# Patient Record
Sex: Female | Born: 1980 | Race: White | Hispanic: No | Marital: Single | Smoking: Current every day smoker
Health system: Southern US, Community
[De-identification: ages and names within clinical notes are randomized; demographics above are authoritative.]

## PROBLEM LIST (undated history)

## (undated) DIAGNOSIS — F319 Bipolar disorder, unspecified: Secondary | ICD-10-CM

## (undated) DIAGNOSIS — F4310 Post-traumatic stress disorder, unspecified: Secondary | ICD-10-CM

## (undated) DIAGNOSIS — F32A Depression, unspecified: Secondary | ICD-10-CM

## (undated) DIAGNOSIS — T1491XA Suicide attempt, initial encounter: Secondary | ICD-10-CM

## (undated) DIAGNOSIS — K8020 Calculus of gallbladder without cholecystitis without obstruction: Secondary | ICD-10-CM

## (undated) DIAGNOSIS — E039 Hypothyroidism, unspecified: Secondary | ICD-10-CM

## (undated) DIAGNOSIS — F329 Major depressive disorder, single episode, unspecified: Secondary | ICD-10-CM

## (undated) DIAGNOSIS — M1990 Unspecified osteoarthritis, unspecified site: Secondary | ICD-10-CM

## (undated) DIAGNOSIS — F419 Anxiety disorder, unspecified: Secondary | ICD-10-CM

## (undated) HISTORY — PX: KNEE SURGERY: SHX244

## (undated) HISTORY — DX: Post-traumatic stress disorder, unspecified: F43.10

## (undated) HISTORY — DX: Bipolar disorder, unspecified: F31.9

---
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
## 2001-06-07 ENCOUNTER — Encounter: Admission: RE | Admit: 2001-06-07 | Discharge: 2001-06-07 | Payer: Self-pay | Admitting: Physician Assistant

## 2001-10-26 ENCOUNTER — Other Ambulatory Visit: Admission: RE | Admit: 2001-10-26 | Discharge: 2001-10-26 | Payer: Self-pay | Admitting: Obstetrics and Gynecology

## 2002-09-02 ENCOUNTER — Emergency Department (HOSPITAL_COMMUNITY): Admission: EM | Admit: 2002-09-02 | Discharge: 2002-09-03 | Payer: Self-pay | Admitting: Emergency Medicine

## 2004-10-15 ENCOUNTER — Other Ambulatory Visit: Admission: RE | Admit: 2004-10-15 | Discharge: 2004-10-15 | Payer: Self-pay | Admitting: Obstetrics and Gynecology

## 2006-06-08 ENCOUNTER — Emergency Department (HOSPITAL_COMMUNITY): Admission: EM | Admit: 2006-06-08 | Discharge: 2006-06-09 | Payer: Self-pay | Admitting: Emergency Medicine

## 2006-12-31 ENCOUNTER — Emergency Department (HOSPITAL_COMMUNITY): Admission: EM | Admit: 2006-12-31 | Discharge: 2006-12-31 | Payer: Self-pay | Admitting: Emergency Medicine

## 2007-09-25 ENCOUNTER — Emergency Department (HOSPITAL_COMMUNITY): Admission: EM | Admit: 2007-09-25 | Discharge: 2007-09-25 | Payer: Self-pay | Admitting: Emergency Medicine

## 2008-03-06 ENCOUNTER — Emergency Department (HOSPITAL_BASED_OUTPATIENT_CLINIC_OR_DEPARTMENT_OTHER): Admission: EM | Admit: 2008-03-06 | Discharge: 2008-03-06 | Payer: Self-pay | Admitting: Emergency Medicine

## 2008-04-11 ENCOUNTER — Ambulatory Visit (HOSPITAL_COMMUNITY): Admission: RE | Admit: 2008-04-11 | Discharge: 2008-04-11 | Payer: Self-pay | Admitting: Obstetrics and Gynecology

## 2010-01-02 ENCOUNTER — Encounter: Admission: RE | Admit: 2010-01-02 | Discharge: 2010-01-02 | Payer: Self-pay | Admitting: Family Medicine

## 2010-01-14 ENCOUNTER — Emergency Department (HOSPITAL_BASED_OUTPATIENT_CLINIC_OR_DEPARTMENT_OTHER): Admission: EM | Admit: 2010-01-14 | Discharge: 2010-01-14 | Payer: Self-pay | Admitting: Emergency Medicine

## 2010-09-21 ENCOUNTER — Encounter: Payer: Self-pay | Admitting: Gastroenterology

## 2010-11-17 LAB — DIFFERENTIAL
Basophils Absolute: 0.1 10*3/uL (ref 0.0–0.1)
Basophils Relative: 1 % (ref 0–1)
Lymphocytes Relative: 33 % (ref 12–46)
Monocytes Absolute: 1 10*3/uL (ref 0.1–1.0)
Neutro Abs: 4.1 10*3/uL (ref 1.7–7.7)
Neutrophils Relative %: 53 % (ref 43–77)

## 2010-11-17 LAB — COMPREHENSIVE METABOLIC PANEL
Albumin: 3.9 g/dL (ref 3.5–5.2)
Alkaline Phosphatase: 65 U/L (ref 39–117)
BUN: 8 mg/dL (ref 6–23)
Chloride: 108 mEq/L (ref 96–112)
Creatinine, Ser: 0.6 mg/dL (ref 0.4–1.2)
Glucose, Bld: 70 mg/dL (ref 70–99)
Potassium: 4.3 mEq/L (ref 3.5–5.1)
Total Bilirubin: 0.4 mg/dL (ref 0.3–1.2)
Total Protein: 7.2 g/dL (ref 6.0–8.3)

## 2010-11-17 LAB — URINALYSIS, ROUTINE W REFLEX MICROSCOPIC
Bilirubin Urine: NEGATIVE
Glucose, UA: NEGATIVE mg/dL
Hgb urine dipstick: NEGATIVE
Ketones, ur: NEGATIVE mg/dL
Protein, ur: NEGATIVE mg/dL
pH: 6 (ref 5.0–8.0)

## 2010-11-17 LAB — ROCKY MTN SPOTTED FVR AB, IGM-BLOOD: RMSF IgM: 0.27 IV (ref 0.00–0.89)

## 2010-11-17 LAB — CBC
HCT: 45.1 % (ref 36.0–46.0)
Hemoglobin: 15.3 g/dL — ABNORMAL HIGH (ref 12.0–15.0)
MCV: 93.2 fL (ref 78.0–100.0)
Platelets: 220 10*3/uL (ref 150–400)
RDW: 12.9 % (ref 11.5–15.5)

## 2010-11-17 LAB — B. BURGDORFI ANTIBODIES: B burgdorferi Ab IgG+IgM: 0.43 {ISR}

## 2010-11-17 LAB — ROCKY MTN SPOTTED FVR AB, IGG-BLOOD: RMSF IgG: 0.27 IV

## 2010-11-17 LAB — PREGNANCY, URINE: Preg Test, Ur: NEGATIVE

## 2011-01-13 NOTE — H&P (Signed)
[name], [name]            ACCOUNT NO.:  [id number]   MEDICAL RECORD NO.:  [id number]          PATIENT TYPE:  AMB   LOCATION:  [facility]C                           FACILITY:  [facility]   PHYSICIAN:  Lenoard Aden, M.D.DATE OF BIRTH:  [birth year]   DATE OF ADMISSION:  04/11/2008  DATE OF DISCHARGE:                              HISTORY & PHYSICAL   CHIEF COMPLAINT:  Dyspareunia and dysmenorrhea.   She is a 30 year old white female G0, P0 with worsening dysmenorrhea,  menorrhagia, and pelvic dyspareunia who presents for evaluation by  laparoscopy.   She has no known allergies.   MEDICATIONS:  Multivitamins.   She has a family history of diabetes, heart disease, chronic  hypertension, and lung cancer.   She is a nonsmoker and nondrinker.  She denies domestic or physical  violence.   Surgical history is remarkable for breast reduction, hip surgery, and  knee surgery x3.  She has a history of inability to conceive.   Currently, on no medications and normal laboratory workup to date.   PHYSICAL EXAMINATION:  GENERAL:  She is a well-developed, well-nourished  white female in no acute distress.  VITAL SIGNS:  Blood pressure of 104/80, weight of 219 pounds.  HEENT:  Normal.  LUNGS:  Clear.  HEART:  Regular rate and rhythm.  ABDOMEN:  Soft and nontender.  PELVIC:  Normal size, shape, and contour uterus and no adnexal masses.   IMPRESSION:  Worsening dysmenorrhea and dyspareunia for surgical  intervention.   PLAN:  Proceed with diagnostic laparoscopy, possible ablation of  endometriosis, possible chromopertubation risks of anesthesia,  infection, bleeding, injury to abdominal organs, need for repair were  discussed.  Delayed versus immediate complications to include bowel and  bladder injury noted.  The patient acknowledges and wishes to proceed.      Lenoard Aden, M.D.  Electronically Signed     RJT/MEDQ  D:  04/11/2008  T:  04/12/2008  Job:  [id number]

## 2011-01-13 NOTE — Op Note (Signed)
[name] [name], [name] [name]            ACCOUNT NO.:  [id number]   MEDICAL RECORD NO.:  [id number]          PATIENT TYPE:  AMB   LOCATION:  [facility]C                           FACILITY:  [facility]   PHYSICIAN:  Lenoard Aden, M.D.DATE OF BIRTH:  1980/12/10   DATE OF PROCEDURE:  04/11/2008  DATE OF DISCHARGE:                               OPERATIVE REPORT   PREOPERATIVE DIAGNOSES:  1. Pelvic pain.  2. Dysmenorrhea.  3. Dyspareunia.   POSTOPERATIVE DIAGNOSES:  1. Pelvic endometriosis.  2. Right paraovarian adhesions to the right ovarian cul-de-sac.   PROCEDURES:  1. Diagnostic laparoscopy.  2. Lysis of adhesions.  3. Ablation of cul-de-sac endometriosis.  4. Chromopertubation.   SURGEON:  Lenoard Aden, MD   ANESTHESIA:  General.   ESTIMATED BLOOD LOSS:  Less than 50 mL.   COMPLICATIONS:  None.   DRAINS:  None.   COUNTS:  Correct.   The patient was taken to recovery in good condition.   BRIEF OPERATIVE NOTE:  After being apprised of the risks of anesthesia,  infection, bleeding, injury to abdominal organs, and need for repair,  labor's immediate complications including bowel and bladder injury,  inability to cure pelvic pain, the patient brought to the operating room  where she was administered a general anesthetic without complications,  prepped and draped in usual sterile fashion, catheterized till the  bladder was empty.  Exam under anesthesia reveals an anteflexed uterus  with no adnexal masses.  Bladder was catheterized for about 100 mL of  clear urine without difficulty.  Rubens cannula was placed per vagina  and hooked up for chromopertubation.  At this time, an infraumbilical  incision made with a scalpel after placement of dilute Marcaine  solution.  Veress needle placed, opening pressure -1 noted, 3.5 L of CO2  insufflated without difficulty.  Trocar placed atraumatically and  pictures taken.  Normal liver gallbladder bed, normal appendiceal area,  normal uterus,  normal anterior cul-de-sac, and normal left adnexa.  Both  tubes appear fine and delicate and contour in both appeared normal.  No  evidence of hydrosalpinx.  The left and right ovary do also appear  normal; however, the right ovary is encased and filmy adhesions which is  adhering it into the right ovarian fossa.  A 5-mm Treitz site is made  suprapubically and these adhesions are lysed close to the ovary using  delicate sharp dissection and completely freeing the ovary to a  completely mobile status.  Good hemostasis was noted and hemostasis was  achieved by cauterizing these filmy implants with a Kleppinger bipolar  cautery.  The left ovary otherwise appears normal.  The right ovary  appears normal.  There are some left cul-de-sac implants, which appear  to be vesicular and formed consistent with active endometriosis.  These  are grasped, elevated with a Kleppinger, and cauterized completely  ablating these lesions after observing the course of the left ureter.  Good hemostasis was noted.  Irrigation accomplished.  All instruments  were removed under direct visualization.  CO2 released.  Incisions  closed using 0 Vicryl and Dermabond.  Instrument was removed from  the  vagina.  The patient was awakened and transferred to recovery room in  good condition.      Lenoard Aden, M.D.  Electronically Signed     RJT/MEDQ  D:  04/11/2008  T:  04/12/2008  Job:  [id number]

## 2011-02-10 ENCOUNTER — Emergency Department (HOSPITAL_COMMUNITY)
Admission: EM | Admit: 2011-02-10 | Discharge: 2011-02-10 | Disposition: A | Discharge status: Home / Self Care | Payer: Self-pay | Attending: Emergency Medicine | Admitting: Emergency Medicine

## 2011-02-10 DIAGNOSIS — E039 Hypothyroidism, unspecified: Secondary | ICD-10-CM | POA: Insufficient documentation

## 2011-02-10 DIAGNOSIS — K089 Disorder of teeth and supporting structures, unspecified: Secondary | ICD-10-CM | POA: Insufficient documentation

## 2011-02-10 DIAGNOSIS — K029 Dental caries, unspecified: Secondary | ICD-10-CM | POA: Insufficient documentation

## 2011-05-28 LAB — WET PREP, GENITAL
Clue Cells Wet Prep HPF POC: NONE SEEN
Trich, Wet Prep: NONE SEEN
Yeast Wet Prep HPF POC: NONE SEEN

## 2011-05-28 LAB — URINE MICROSCOPIC-ADD ON

## 2011-05-28 LAB — URINALYSIS, ROUTINE W REFLEX MICROSCOPIC
Bilirubin Urine: NEGATIVE
Glucose, UA: NEGATIVE
Ketones, ur: NEGATIVE
Protein, ur: NEGATIVE
Urobilinogen, UA: 0.2

## 2011-05-28 LAB — GC/CHLAMYDIA PROBE AMP, GENITAL
Chlamydia, DNA Probe: NEGATIVE
GC Probe Amp, Genital: NEGATIVE

## 2011-05-29 LAB — HCG, SERUM, QUALITATIVE: Preg, Serum: NEGATIVE

## 2011-05-29 LAB — CBC
MCHC: 34
MCV: 94.5
Platelets: 277
RBC: 4.84
RDW: 12.6

## 2011-06-22 ENCOUNTER — Ambulatory Visit (HOSPITAL_COMMUNITY): Admission: RE | Admit: 2011-06-22 | Payer: Self-pay | Source: Ambulatory Visit | Admitting: Internal Medicine

## 2011-10-13 ENCOUNTER — Emergency Department (INDEPENDENT_AMBULATORY_CARE_PROVIDER_SITE_OTHER): Payer: Self-pay

## 2011-10-13 ENCOUNTER — Encounter (HOSPITAL_BASED_OUTPATIENT_CLINIC_OR_DEPARTMENT_OTHER): Payer: Self-pay | Admitting: *Deleted

## 2011-10-13 ENCOUNTER — Emergency Department (HOSPITAL_BASED_OUTPATIENT_CLINIC_OR_DEPARTMENT_OTHER)
Admission: EM | Admit: 2011-10-13 | Discharge: 2011-10-13 | Disposition: A | Discharge status: Home / Self Care | Payer: Self-pay | Attending: Emergency Medicine | Admitting: Emergency Medicine

## 2011-10-13 DIAGNOSIS — J3489 Other specified disorders of nose and nasal sinuses: Secondary | ICD-10-CM | POA: Insufficient documentation

## 2011-10-13 DIAGNOSIS — R05 Cough: Secondary | ICD-10-CM

## 2011-10-13 DIAGNOSIS — J029 Acute pharyngitis, unspecified: Secondary | ICD-10-CM

## 2011-10-13 DIAGNOSIS — R918 Other nonspecific abnormal finding of lung field: Secondary | ICD-10-CM

## 2011-10-13 DIAGNOSIS — E079 Disorder of thyroid, unspecified: Secondary | ICD-10-CM | POA: Insufficient documentation

## 2011-10-13 DIAGNOSIS — J40 Bronchitis, not specified as acute or chronic: Secondary | ICD-10-CM | POA: Insufficient documentation

## 2011-10-13 MED ORDER — PREDNISONE 20 MG PO TABS: 60.0000 mg | ORAL_TABLET | Freq: Every day | ORAL | Status: DC

## 2011-10-13 MED ORDER — PREDNISONE 10 MG PO TABS
60.0000 mg | ORAL_TABLET | Freq: Once | ORAL | Status: AC
  Administered 2011-10-13: 60 mg via ORAL
  Filled 2011-10-13: qty 1

## 2011-10-13 MED ORDER — PREDNISONE 10 MG PO TABS: 60.0000 mg | ORAL_TABLET | Freq: Every day | ORAL | Status: DC

## 2011-10-13 MED ORDER — ALBUTEROL SULFATE HFA 108 (90 BASE) MCG/ACT IN AERS
2.0000 | INHALATION_SPRAY | RESPIRATORY_TRACT | Status: DC | PRN
  Administered 2011-10-13: 2 via RESPIRATORY_TRACT
  Filled 2011-10-13: qty 6.7

## 2011-10-13 MED ORDER — ALBUTEROL SULFATE (5 MG/ML) 0.5% IN NEBU
2.5000 mg | INHALATION_SOLUTION | Freq: Once | RESPIRATORY_TRACT | Status: AC
  Administered 2011-10-13: 2.5 mg via RESPIRATORY_TRACT
  Filled 2011-10-13 (×2): qty 0.5

## 2011-10-13 MED ORDER — IPRATROPIUM BROMIDE 0.02 % IN SOLN
0.5000 mg | Freq: Once | RESPIRATORY_TRACT | Status: AC
  Administered 2011-10-13: 0.5 mg via RESPIRATORY_TRACT
  Filled 2011-10-13: qty 2.5

## 2011-10-13 NOTE — Discharge Instructions (Signed)
Bronchitis Bronchitis is the body's way of reacting to injury and/or infection (inflammation) of the bronchi. Bronchi are the air tubes that extend from the windpipe into the lungs. If the inflammation becomes severe, it may cause shortness of breath. CAUSES  Inflammation may be caused by:  A virus.   Germs (bacteria).   Dust.   Allergens.   Pollutants and many other irritants.  The cells lining the bronchial tree are covered with tiny hairs (cilia). These constantly beat upward, away from the lungs, toward the mouth. This keeps the lungs free of pollutants. When these cells become too irritated and are unable to do their job, mucus begins to develop. This causes the characteristic cough of bronchitis. The cough clears the lungs when the cilia are unable to do their job. Without either of these protective mechanisms, the mucus would settle in the lungs. Then you would develop pneumonia. Smoking is a common cause of bronchitis and can contribute to pneumonia. Stopping this habit is the single most important thing you can do to help yourself. TREATMENT   Your caregiver may prescribe an antibiotic if the cough is caused by bacteria. Also, medicines that open up your airways make it easier to breathe. Your caregiver may also recommend or prescribe an expectorant. It will loosen the mucus to be coughed up. Only take over-the-counter or prescription medicines for pain, discomfort, or fever as directed by your caregiver.   Removing whatever causes the problem (smoking, for example) is critical to preventing the problem from getting worse.   Cough suppressants may be prescribed for relief of cough symptoms.   Inhaled medicines may be prescribed to help with symptoms now and to help prevent problems from returning.   For those with recurrent (chronic) bronchitis, there may be a need for steroid medicines.  SEEK IMMEDIATE MEDICAL CARE IF:   During treatment, you develop more pus-like mucus  (purulent sputum).   You have a fever.   Your baby is older than 3 months with a rectal temperature of 102 F (38.9 C) or higher.   Your baby is 36 months old or younger with a rectal temperature of 100.4 F (38 C) or higher.   You become progressively more ill.   You have increased difficulty breathing, wheezing, or shortness of breath.  It is necessary to seek immediate medical care if you are elderly or sick from any other disease. MAKE SURE YOU:   Understand these instructions.   Will watch your condition.   Will get help right away if you are not doing well or get worse.  Document Released: 08/17/2005 Document Revised: 04/29/2011 Document Reviewed: 06/26/2008 [facility] Patient Information 2012 [place], [place]. Pharyngitis, Viral and Bacterial Pharyngitis is soreness (inflammation) or infection of the pharynx. It is also called a sore throat. CAUSES  Most sore throats are caused by viruses and are part of a cold. However, some sore throats are caused by strep and other bacteria. Sore throats can also be caused by post nasal drip from draining sinuses, allergies and sometimes from sleeping with an open mouth. Infectious sore throats can be spread from person to person by coughing, sneezing and sharing cups or eating utensils. TREATMENT  Sore throats that are viral usually last 3-4 days. Viral illness will get better without medications (antibiotics). Strep throat and other bacterial infections will usually begin to get better about 24-48 hours after you begin to take antibiotics. HOME CARE INSTRUCTIONS   If the caregiver feels there is a bacterial infection or  if there is a positive strep test, they will prescribe an antibiotic. The full course of antibiotics must be taken. If the full course of antibiotic is not taken, you or your child may become ill again. If you or your child has strep throat and do not finish all of the medication, serious heart or kidney diseases may develop.     Drink enough water and fluids to keep your urine clear or pale yellow.   Only take over-the-counter or prescription medicines for pain, discomfort or fever as directed by your caregiver.   Get lots of rest.   Gargle with salt water ( tsp. of salt in a glass of water) as often as every 1-2 hours as you need for comfort.   Hard candies may soothe the throat if individual is not at risk for choking. Throat sprays or lozenges may also be used.  SEEK MEDICAL CARE IF:   Large, tender lumps in the neck develop.   A rash develops.   Green, yellow-brown or bloody sputum is coughed up.   Your baby is older than 3 months with a rectal temperature of 100.5 F (38.1 C) or higher for more than 1 day.  SEEK IMMEDIATE MEDICAL CARE IF:   A stiff neck develops.   You or your child are drooling or unable to swallow liquids.   You or your child are vomiting, unable to keep medications or liquids down.   You or your child has severe pain, unrelieved with recommended medications.   You or your child are having difficulty breathing (not due to stuffy nose).   You or your child are unable to fully open your mouth.   You or your child develop redness, swelling, or severe pain anywhere on the neck.   You have a fever.   Your baby is older than 3 months with a rectal temperature of 102 F (38.9 C) or higher.   Your baby is 54 months old or younger with a rectal temperature of 100.4 F (38 C) or higher.  MAKE SURE YOU:   Understand these instructions.   Will watch your condition.   Will get help right away if you are not doing well or get worse.  Document Released: 08/17/2005 Document Revised: 04/29/2011 Document Reviewed: 11/14/2007 [facility] Patient Information 2012 [place], [place].  Albuterol inhalation aerosol What is this medicine? ALBUTEROL (al Gaspar Bidding) is a bronchodilator. It helps open up the airways in your lungs to make it easier to breathe. This medicine is used to treat  and to prevent bronchospasm. This medicine may be used for other purposes; ask your health care provider or pharmacist if you have questions. What should I tell my health care provider before I take this medicine? They need to know if you have any of the following conditions: -diabetes -heart disease or irregular heartbeat -high blood pressure -pheochromocytoma -seizures -thyroid disease -an unusual or allergic reaction to albuterol, levalbuterol, sulfites, other medicines, foods, dyes, or preservatives -pregnant or trying to get pregnant -breast-feeding How should I use this medicine? This medicine is for inhalation through the mouth. Follow the directions on your prescription label. Take your medicine at regular intervals. Do not use more often than directed. Make sure that you are using your inhaler correctly. Ask you doctor or health care provider if you have any questions. Use this medicine before you use any other inhaler. Wait 5 minutes or more before between using different inhalers. Talk to your pediatrician regarding the use of this  medicine in children. Special care may be needed. Overdosage: If you think you have taken too much of this medicine contact a poison control center or emergency room at once. NOTE: This medicine is only for you. Do not share this medicine with others. What if I miss a dose? If you miss a dose, use it as soon as you can. If it is almost time for your next dose, use only that dose. Do not use double or extra doses. What may interact with this medicine? -anti-infectives like chloroquine and pentamidine -caffeine -cisapride -diuretics -medicines for colds -medicines for depression or for emotional or psychotic conditions -medicines for weight loss including some herbal products -methadone -some antibiotics like clarithromycin, erythromycin, levofloxacin, and linezolid -some heart medicines -steroid hormones like dexamethasone, cortisone,  hydrocortisone -theophylline -thyroid hormones This list may not describe all possible interactions. Give your health care provider a list of all the medicines, herbs, non-prescription drugs, or dietary supplements you use. Also tell them if you smoke, drink alcohol, or use illegal drugs. Some items may interact with your medicine. What should I watch for while using this medicine? Tell your doctor or health care professional if your symptoms do not improve. Do not use extra albuterol. If your asthma or bronchitis gets worse while you are using this medicine, call your doctor right away. If your mouth gets dry try chewing sugarless gum or sucking hard candy. Drink water as directed. What side effects may I notice from receiving this medicine? Side effects that you should report to your doctor or health care professional as soon as possible: -allergic reactions like skin rash, itching or hives, swelling of the face, lips, or tongue -breathing problems -chest pain -feeling faint or lightheaded, falls -high blood pressure -irregular heartbeat -fever -muscle cramps or weakness -pain, tingling, numbness in the hands or feet -vomiting Side effects that usually do not require medical attention (report to your doctor or health care professional if they continue or are bothersome): -cough -difficulty sleeping -headache -nervousness or trembling -stomach upset -stuffy or runny nose -throat irritation -unusual taste This list may not describe all possible side effects. Call your doctor for medical advice about side effects. You may report side effects to FDA at 1-800-FDA-1088. Where should I keep my medicine? Keep out of the reach of children. Store at room temperature between 15 and 30 degrees C (59 and 86 degrees F). The contents are under pressure and may burst when exposed to heat or flame. Do not freeze. This medicine does not work as well if it is too cold. Throw away any unused medicine  after the expiration date. Inhalers need to be thrown away after the labeled number of puffs have been used or by the expiration date; whichever comes first. Ventolin HFA should be thrown away 12 months after removing from foil pouch. Check the instructions that come with your medicine. NOTE: This sheet is a summary. It may not cover all possible information. If you have questions about this medicine, talk to your doctor, pharmacist, or health care provider.  2012, [employer]. (01/02/2011 11:00:52 AM)  Prednisone tablets What is this medicine? PREDNISONE (PRED ni sone) is a corticosteroid. It is commonly used to treat inflammation of the skin, joints, lungs, and other organs. Common conditions treated include asthma, allergies, and arthritis. It is also used for other conditions, such as blood disorders and diseases of the adrenal glands. This medicine may be used for other purposes; ask your health care provider or pharmacist if  you have questions. What should I tell my health care provider before I take this medicine? They need to know if you have any of these conditions: -Cushing's syndrome -diabetes -glaucoma -heart disease -high blood pressure -infection (especially a virus infection such as chickenpox, cold sores, or herpes) -kidney disease -liver disease -mental illness -myasthenia gravis -osteoporosis -seizures -stomach or intestine problems -thyroid disease -an unusual or allergic reaction to lactose, prednisone, other medicines, foods, dyes, or preservatives -pregnant or trying to get pregnant -breast-feeding How should I use this medicine? Take this medicine by mouth with a glass of water. Follow the directions on the prescription label. Take this medicine with food. If you are taking this medicine once a day, take it in the morning. Do not take more medicine than you are told to take. Do not suddenly stop taking your medicine because you may develop a severe  reaction. Your doctor will tell you how much medicine to take. If your doctor wants you to stop the medicine, the dose may be slowly lowered over time to avoid any side effects. Talk to your pediatrician regarding the use of this medicine in children. Special care may be needed. Overdosage: If you think you have taken too much of this medicine contact a poison control center or emergency room at once. NOTE: This medicine is only for you. Do not share this medicine with others. What if I miss a dose? If you miss a dose, take it as soon as you can. If it is almost time for your next dose, talk to your doctor or health care professional. You may need to miss a dose or take an extra dose. Do not take double or extra doses without advice. What may interact with this medicine? Do not take this medicine with any of the following medications: -metyrapone -mifepristone This medicine may also interact with the following medications: -aminoglutethimide -amphotericin B -aspirin and aspirin-like medicines -barbiturates -certain medicines for diabetes, like glipizide or glyburide -cholestyramine -cholinesterase inhibitors -cyclosporine -digoxin -diuretics -ephedrine -female hormones, like estrogens and birth control pills -isoniazid -ketoconazole -NSAIDS, medicines for pain and inflammation, like ibuprofen or naproxen -phenytoin -rifampin -toxoids -vaccines -warfarin This list may not describe all possible interactions. Give your health care provider a list of all the medicines, herbs, non-prescription drugs, or dietary supplements you use. Also tell them if you smoke, drink alcohol, or use illegal drugs. Some items may interact with your medicine. What should I watch for while using this medicine? Visit your doctor or health care professional for regular checks on your progress. If you are taking this medicine over a prolonged period, carry an identification card with your name and address, the  type and dose of your medicine, and your doctor's name and address. This medicine may increase your risk of getting an infection. Tell your doctor or health care professional if you are around anyone with measles or chickenpox, or if you develop sores or blisters that do not heal properly. If you are going to have surgery, tell your doctor or health care professional that you have taken this medicine within the last twelve months. Ask your doctor or health care professional about your diet. You may need to lower the amount of salt you eat. This medicine may affect blood sugar levels. If you have diabetes, check with your doctor or health care professional before you change your diet or the dose of your diabetic medicine. What side effects may I notice from receiving this medicine? Side effects that you  should report to your doctor or health care professional as soon as possible: -allergic reactions like skin rash, itching or hives, swelling of the face, lips, or tongue -changes in emotions or moods -changes in vision -depressed mood -eye pain -fever or chills, cough, sore throat, pain or difficulty passing urine -increased thirst -swelling of ankles, feet Side effects that usually do not require medical attention (report to your doctor or health care professional if they continue or are bothersome): -confusion, excitement, restlessness -headache -nausea, vomiting -skin problems, acne, thin and shiny skin -trouble sleeping -weight gain This list may not describe all possible side effects. Call your doctor for medical advice about side effects. You may report side effects to FDA at 1-800-FDA-1088. Where should I keep my medicine? Keep out of the reach of children. Store at room temperature between 15 and 30 degrees C (59 and 86 degrees F). Protect from light. Keep container tightly closed. Throw away any unused medicine after the expiration date. NOTE: This sheet is a summary. It may not  cover all possible information. If you have questions about this medicine, talk to your doctor, pharmacist, or health care provider.  2012, [employer]. (04/02/2011 10:57:14 AM)  Smoking Hazards Smoking cigarettes is extremely bad for your health. Tobacco smoke has over 200 known poisons in it. There are over 60 chemicals in tobacco smoke that cause cancer. Some of the chemicals found in cigarette smoke include:   Cyanide.   Benzene.   Formaldehyde.   Methanol (wood alcohol).   Acetylene (fuel used in welding torches).   Ammonia.  Cigarette smoke also contains the poisonous gases nitrogen oxide and carbon monoxide.  Cigarette smokers have an increased risk of many serious medical problems, including:  Lung cancer.   Lung disease (such as pneumonia, bronchitis, and emphysema).   Heart attack and chest pain due to the heart not getting enough oxygen (angina).   Heart disease and peripheral blood vessel disease.   Hypertension.   Stroke.   Oral cancer (cancer of the lip, mouth, or voice box).   Bladder cancer.   Pancreatic cancer.   Cervical cancer.   Pregnancy complications, including premature birth.   Low birthweight babies.   Early menopause.   Lower estrogen level for women.   Infertility.   Facial wrinkles.   Blindness.   Increased risk of broken bones (fractures).   Senile dementia.   Stillbirths and smaller newborn babies, birth defects, and genetic damage to sperm.   Stomach ulcers and internal bleeding.  Children of smokers have an increased risk of the following, because of secondhand smoke exposure:   Sudden infant death syndrome (SIDS).   Respiratory infections.   Lung cancer.   Heart disease.   Ear infections.  Smoking causes approximately:  90% of all lung cancer deaths in men.   80% of all lung cancer deaths in women.   90% of deaths from chronic obstructive lung disease.  Compared with nonsmokers, smoking increases  the risk of:  Coronary heart disease by 2 to 4 times.   Stroke by 2 to 4 times.   Men developing lung cancer by 23 times.   Women developing lung cancer by 13 times.   Dying from chronic obstructive lung diseases by 12 times.  Someone who smokes 2 packs a day loses about 8 years of his or her life. Even smoking lightly shortens your life expectancy by several years. You can greatly reduce the risk of medical problems for you and your family by  stopping now. Smoking is the most preventable cause of death and disease in our society. Within days of quitting smoking, your circulation returns to normal, you decrease the risk of having a heart attack, and your lung capacity improves. There may be some increased phlegm in the first few days after quitting, and it may take months for your lungs to clear up completely. Quitting for 10 years cuts your lung cancer risk to almost that of a nonsmoker. WHY IS SMOKING ADDICTIVE?  Nicotine is the chemical agent in tobacco that is capable of causing addiction or dependence.   When you smoke and inhale, nicotine is absorbed rapidly into the bloodstream through your lungs. Nicotine absorbed through the lungs is capable of creating a powerful addiction. Both inhaled and non-inhaled nicotine may be addictive.   Addiction studies of cigarettes and spit tobacco show that addiction to nicotine occurs mainly during the teen years, when young people begin using tobacco products.  WHAT ARE THE BENEFITS OF QUITTING?  There are many health benefits to quitting smoking.   Likelihood of developing cancer and heart disease decreases. Health improvements are seen almost immediately.   Blood pressure, pulse rate, and breathing patterns start returning to normal soon after quitting.   People who quit may see an improvement in their overall quality of life.  Some people choose to quit all at once. Other options include nicotine replacement products, such as patches, gum, and  nasal sprays. Do not use these products without first checking with your caregiver. QUITTING SMOKING It is not easy to quit smoking. Nicotine is addicting, and longtime habits are hard to change. To start, you can write down all your reasons for quitting, tell your family and friends you want to quit, and ask for their help. Throw your cigarettes away, chew gum or cinnamon sticks, keep your hands busy, and drink extra water or juice. Go for walks and practice deep breathing to relax. Think of all the money you are saving: around $1,000 a year, for the average pack-a-day smoker. Nicotine patches and gum have been shown to improve success at efforts to stop smoking. Zyban (bupropion) is an anti-depressant drug that can be prescribed to reduce nicotine withdrawal symptoms and to suppress the urge to smoke. Smoking is an addiction with both physical and psychological effects. Joining a stop-smoking support group can help you cope with the emotional issues. For more information and advice on programs to stop smoking, call your doctor, your local hospital, or these organizations:  American Lung Association - 1-800-LUNGUSA   American Cancer Society - 1-800-ACS-2345  Document Released: 09/24/2004 Document Revised: 04/29/2011 Document Reviewed: 05/29/2009 [facility] Patient Information 2012 [place], [place].

## 2011-10-13 NOTE — ED Notes (Signed)
Pt states that for 5 days she has had cough congestion sore throat pain with deep inspiration in rib cage area denies nausea or vomiting. Pt states she has been taking mucinex and robitussin

## 2011-10-13 NOTE — ED Provider Notes (Signed)
History     CSN: [id number]  Arrival date & time 10/13/11  0904   None     Chief Complaint  Patient presents with  . Sore Throat  . Cough  . Nasal Congestion    (Consider location/radiation/quality/duration/timing/severity/associated sxs/prior treatment) Patient is a 31 y.o. female presenting with pharyngitis and cough. The history is provided by the patient.  Sore Throat  Cough  She has been sick for the last 5 days with nasal congestion, sore throat, cough productive of greenish sputum which is occasionally blood streaked. She has had fever to 101, chills, sweats. She is complaining of some shortness of breath. There is chest pain with coughing. She is complaining of arthralgias and myalgias. She has had some ear pain. Pain is moderate to severe and she rates it 7/10. She's tried taking Robitussin, Mucinex, and NyQuil with no relief. She has had post tussive emesis, but no nausea. She denies sick contacts. She describes her symptoms as moderate to severe. She did not get a flu shot this year.  Past Medical History  Diagnosis Date  . Thyroid disease     History reviewed. No pertinent past surgical history.  History reviewed. No pertinent family history.  History  Substance Use Topics  . Smoking status: Current Everyday Smoker  . Smokeless tobacco: Not on file  . Alcohol Use: Yes     occ    OB History    Grav Para Term Preterm Abortions TAB SAB Ect Mult Living                  Review of Systems  Respiratory: Positive for cough.   All other systems reviewed and are negative.    Allergies  Review of patient's allergies indicates no known allergies.  Home Medications  No current outpatient prescriptions on file.  BP 121/78  Pulse 95  Temp(Src) 98.4 F (36.9 C) (Oral)  Resp 22  SpO2 99%  LMP 09/29/2011  Physical Exam  Nursing note and vitals reviewed.  31 year old female who is resting comfortably and in no acute distress. Vital signs are significant  for mild tachypnea with respiratory rate of 22. Oxygen saturation is 99% which is normal. Head is normocephalic and atraumatic. PERRLA, EOMI. TMs are clear. There is no sinus tenderness. Oropharynx shows moderate erythema without exudate. She has no difficulty with her secretions and phonation is normal. Neck is nontender and supple without adenopathy. Back is nontender. Lungs have a prolonged exhalation phase and wheezes and rhonchi are heard on forced exhalation. There are no rales. There is no chest wall tenderness. Heart has regular rate and rhythm without murmur. Abdomen is soft, flat, nontender without masses or hepatosplenomegaly. Extremities have full range of motion, no cyanosis or edema. Skin is warm and dry without rash. Neurologic: Mental status is normal, cranial nerves are intact, there no focal motor or sensory deficits.  ED Course  Procedures (including critical care time)  Results for orders placed during the hospital encounter of 10/13/11  RAPID STREP SCREEN      Component Value Range   Streptococcus, Group A Screen (Direct) NEGATIVE  NEGATIVE    Dg Chest 2 View  10/13/2011  *RADIOLOGY REPORT*  Clinical Data: 31 year old female with cough, congestion, sore throat.  CHEST - 2 VIEW  Comparison: 12/31/2006 and earlier.  Findings: Better lung volumes.  Cardiac size and mediastinal contours are within normal limits.  Visualized tracheal air column is within normal limits.  There is subtle increased central peribronchial thickening more  apparent at the left hilum and lower lung bronchovascular structures.  Otherwise, the lungs are clear. No pneumothorax or effusion. No acute osseous abnormality identified.  IMPRESSION: Subtle increased peribronchial opacity could reflect viral or reactive airway disease.  No focal pneumonia.  Original Report Authenticated By: Harley Hallmark, M.D.    [phone]: After albuterol nebulizer treatment, she states her breathing and cough has improved. She'll be sent  home with an albuterol inhaler and a prescription for prednisone.  1. Bronchitis   2. Pharyngitis       MDM  Respiratory tract infection-loss of the topicals or throat, rule out pneumonia. She'll be given albuterol to assess response to breathing and cough.        Dione Booze, MD 10/13/11 1024

## 2012-01-04 ENCOUNTER — Encounter (HOSPITAL_COMMUNITY): Payer: Self-pay | Admitting: *Deleted

## 2012-01-04 ENCOUNTER — Emergency Department (HOSPITAL_COMMUNITY)
Admission: EM | Admit: 2012-01-04 | Discharge: 2012-01-04 | Disposition: A | Discharge status: Other Acute Inpatient Hospital | Payer: Self-pay | Attending: Emergency Medicine | Admitting: Emergency Medicine

## 2012-01-04 ENCOUNTER — Inpatient Hospital Stay (HOSPITAL_COMMUNITY)
Admission: AD | Admit: 2012-01-04 | Discharge: 2012-01-07 | DRG: 885 | Disposition: A | Discharge status: Home / Self Care | Payer: 59 | Source: Ambulatory Visit | Attending: Psychiatry | Admitting: Psychiatry

## 2012-01-04 DIAGNOSIS — F17200 Nicotine dependence, unspecified, uncomplicated: Secondary | ICD-10-CM | POA: Diagnosis present

## 2012-01-04 DIAGNOSIS — R1013 Epigastric pain: Secondary | ICD-10-CM | POA: Insufficient documentation

## 2012-01-04 DIAGNOSIS — F3289 Other specified depressive episodes: Secondary | ICD-10-CM | POA: Insufficient documentation

## 2012-01-04 DIAGNOSIS — E079 Disorder of thyroid, unspecified: Secondary | ICD-10-CM | POA: Insufficient documentation

## 2012-01-04 DIAGNOSIS — F1990 Other psychoactive substance use, unspecified, uncomplicated: Secondary | ICD-10-CM

## 2012-01-04 DIAGNOSIS — F329 Major depressive disorder, single episode, unspecified: Secondary | ICD-10-CM | POA: Insufficient documentation

## 2012-01-04 DIAGNOSIS — R45851 Suicidal ideations: Secondary | ICD-10-CM | POA: Insufficient documentation

## 2012-01-04 DIAGNOSIS — F3130 Bipolar disorder, current episode depressed, mild or moderate severity, unspecified: Principal | ICD-10-CM | POA: Diagnosis present

## 2012-01-04 DIAGNOSIS — F1210 Cannabis abuse, uncomplicated: Secondary | ICD-10-CM | POA: Diagnosis present

## 2012-01-04 HISTORY — DX: Major depressive disorder, single episode, unspecified: F32.9

## 2012-01-04 HISTORY — DX: Depression, unspecified: F32.A

## 2012-01-04 HISTORY — DX: Anxiety disorder, unspecified: F41.9

## 2012-01-04 LAB — COMPREHENSIVE METABOLIC PANEL
AST: 20 U/L (ref 0–37)
BUN: 6 mg/dL (ref 6–23)
CO2: 22 mEq/L (ref 19–32)
Chloride: 103 mEq/L (ref 96–112)
Creatinine, Ser: 0.6 mg/dL (ref 0.50–1.10)
GFR calc non Af Amer: >90 mL/min (ref >90)
Glucose, Bld: 105 mg/dL — ABNORMAL HIGH (ref 70–99)
Total Bilirubin: 0.5 mg/dL (ref 0.3–1.2)

## 2012-01-04 LAB — TSH: TSH: 8.922 u[IU]/mL — ABNORMAL HIGH (ref 0.350–4.500)

## 2012-01-04 LAB — URINALYSIS, ROUTINE W REFLEX MICROSCOPIC
Bilirubin Urine: NEGATIVE
Leukocytes, UA: NEGATIVE
Nitrite: NEGATIVE
Specific Gravity, Urine: 1.02 (ref 1.005–1.030)
Urobilinogen, UA: 0.2 mg/dL (ref 0.0–1.0)

## 2012-01-04 LAB — DIFFERENTIAL
Lymphocytes Relative: 33 % (ref 12–46)
Lymphs Abs: 3.4 10*3/uL (ref 0.7–4.0)
Monocytes Absolute: 0.8 10*3/uL (ref 0.1–1.0)
Monocytes Relative: 8 % (ref 3–12)
Neutro Abs: 5.8 10*3/uL (ref 1.7–7.7)

## 2012-01-04 LAB — LIPASE, BLOOD: Lipase: 20 U/L (ref 11–59)

## 2012-01-04 LAB — RAPID URINE DRUG SCREEN, HOSP PERFORMED
Barbiturates: NOT DETECTED
Cocaine: NOT DETECTED
Tetrahydrocannabinol: POSITIVE — AB

## 2012-01-04 LAB — CBC
HCT: 46.1 % — ABNORMAL HIGH (ref 36.0–46.0)
Hemoglobin: 16 g/dL — ABNORMAL HIGH (ref 12.0–15.0)
WBC: 10.3 10*3/uL (ref 4.0–10.5)

## 2012-01-04 LAB — URINE MICROSCOPIC-ADD ON

## 2012-01-04 MED ORDER — NICOTINE 21 MG/24HR TD PT24
21.0000 mg | MEDICATED_PATCH | Freq: Once | TRANSDERMAL | Status: DC
  Administered 2012-01-04: 21 mg via TRANSDERMAL
  Filled 2012-01-04: qty 1

## 2012-01-04 MED ORDER — LORAZEPAM 1 MG PO TABS
1.0000 mg | ORAL_TABLET | Freq: Once | ORAL | Status: AC
  Administered 2012-01-04: 1 mg via ORAL
  Filled 2012-01-04: qty 1
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
## 2012-01-04 NOTE — ED Notes (Signed)
Decreased intake, vomiting. Depressed, tearful at triage.

## 2012-01-04 NOTE — ED Notes (Signed)
Pt in paper scrubs and belongings in cabinet, pt wanded by security, no contraband found
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
## 2012-01-04 NOTE — BH Assessment (Signed)
PT WAS ACCEPTED TO [facility] BY DR. Lockie Pares MAZZEI INTO RM 506-1. SUPPORT PAPER HAS BEEN DONE & PT WILL BE TRANSFERRED TO [facility].

## 2012-01-05 ENCOUNTER — Encounter (HOSPITAL_COMMUNITY): Payer: Self-pay | Admitting: *Deleted

## 2012-01-05 DIAGNOSIS — F1990 Other psychoactive substance use, unspecified, uncomplicated: Secondary | ICD-10-CM | POA: Diagnosis present

## 2012-01-05 DIAGNOSIS — F3130 Bipolar disorder, current episode depressed, mild or moderate severity, unspecified: Principal | ICD-10-CM

## 2012-01-05 DIAGNOSIS — F1520 Other stimulant dependence, uncomplicated: Secondary | ICD-10-CM

## 2012-01-05 MED ORDER — LEVOTHYROXINE SODIUM 50 MCG PO TABS
50.0000 ug | ORAL_TABLET | Freq: Every day | ORAL | Status: DC
  Administered 2012-01-06 – 2012-01-07 (×2): 50 ug via ORAL
  Filled 2012-01-05 (×4): qty 1

## 2012-01-05 MED ORDER — CITALOPRAM HYDROBROMIDE 20 MG PO TABS
20.0000 mg | ORAL_TABLET | Freq: Every day | ORAL | Status: DC
  Administered 2012-01-05 – 2012-01-07 (×3): 20 mg via ORAL
  Filled 2012-01-05: qty 14
  Filled 2012-01-05 (×5): qty 1

## 2012-01-05 MED ORDER — NICOTINE 21 MG/24HR TD PT24
21.0000 mg | MEDICATED_PATCH | Freq: Every day | TRANSDERMAL | Status: DC
  Administered 2012-01-05 – 2012-01-06 (×2): 21 mg via TRANSDERMAL
  Filled 2012-01-05: qty 1
  Filled 2012-01-05: qty 14
  Filled 2012-01-05 (×4): qty 1

## 2012-01-05 MED ORDER — NICOTINE POLACRILEX 2 MG MT GUM
2.0000 mg | CHEWING_GUM | OROMUCOSAL | Status: DC | PRN
  Administered 2012-01-05: 2 mg via ORAL

## 2012-01-05 MED ORDER — LEVOTHYROXINE SODIUM 75 MCG PO TABS: 75.0000 ug | ORAL_TABLET | Freq: Every day | ORAL | Status: DC

## 2012-01-05 MED ORDER — MAGNESIUM HYDROXIDE 400 MG/5ML PO SUSP: 30.0000 mL | Freq: Every day | ORAL | Status: DC | PRN

## 2012-01-05 MED ORDER — TRAZODONE HCL 50 MG PO TABS
50.0000 mg | ORAL_TABLET | Freq: Every evening | ORAL | Status: DC | PRN
  Administered 2012-01-05 (×2): 50 mg via ORAL
  Filled 2012-01-05 (×2): qty 1

## 2012-01-05 MED ORDER — ACETAMINOPHEN 325 MG PO TABS: 650.0000 mg | ORAL_TABLET | Freq: Four times a day (QID) | ORAL | Status: DC | PRN

## 2012-01-05 MED ORDER — CARBAMAZEPINE 200 MG PO TABS
200.0000 mg | ORAL_TABLET | Freq: Three times a day (TID) | ORAL | Status: DC
  Administered 2012-01-05 – 2012-01-07 (×7): 200 mg via ORAL
  Filled 2012-01-05 (×2): qty 1
  Filled 2012-01-05: qty 42
  Filled 2012-01-05 (×5): qty 1
  Filled 2012-01-05: qty 42
  Filled 2012-01-05: qty 1
  Filled 2012-01-05: qty 42

## 2012-01-05 MED ORDER — ALUM & MAG HYDROXIDE-SIMETH 200-200-20 MG/5ML PO SUSP: 30.0000 mL | ORAL | Status: DC | PRN

## 2012-01-05 NOTE — Progress Notes (Signed)
[facility] MD Progress Note  01/05/2012 3:37 PM  Diagnosis:  Axis I: Major Depression, Recurrent severe and Substance Abuse  ADL's:  Intact  Sleep: Poor  Appetite:  Fair  Suicidal Ideation:  Pt denies any suicidal thoughts Homicidal Ideation:  Denies adamantly any homicidal thoughts.  Mental Status Examination/Evaluation: Objective:  Appearance: Casual  Eye Contact::  Good  Speech:  Clear and Coherent  Volume:  Normal  Mood:  Anxious and Depressed  Affect:  Congruent  Thought Process:  Coherent  Orientation:  Full  Thought Content:  WDL  Suicidal Thoughts:  No  Homicidal Thoughts:  No  Memory:  Immediate;   Fair  Judgement:  Impaired  Insight:  Lacking  Psychomotor Activity:  Normal  Concentration:  Fair  Recall:  Fair  Akathisia:  No  Handed:  Right  AIMS (if indicated):     Assets:  Communication Skills Desire for Improvement  Sleep:  Number of Hours: 3.75    Vital Signs:Blood pressure 122/88, pulse 76, temperature 98.1 F (36.7 C), temperature source Oral, resp. rate 17, height 5' 9.5" (1.765 m), weight 92.534 kg (204 lb), last menstrual period 01/04/2012. Current Medications: Current Facility-Administered Medications  Medication Dose Route Frequency Provider Last Rate Last Dose  . acetaminophen (TYLENOL) tablet 650 mg  650 mg Oral Q6H PRN Sanjuana Kava, NP      . alum & mag hydroxide-simeth (MAALOX/MYLANTA) 200-200-20 MG/5ML suspension 30 mL  30 mL Oral Q4H PRN Sanjuana Kava, NP      . carbamazepine (TEGRETOL) tablet 200 mg  200 mg Oral TID Sanjuana Kava, NP      . citalopram (CELEXA) tablet 20 mg  20 mg Oral Daily Sanjuana Kava, NP   20 mg at 01/05/12 1441  . levothyroxine (SYNTHROID, LEVOTHROID) tablet 50 mcg  50 mcg Oral QAC breakfast Sanjuana Kava, NP      . magnesium hydroxide (MILK OF MAGNESIA) suspension 30 mL  30 mL Oral Daily PRN Sanjuana Kava, NP      . nicotine (NICODERM CQ - dosed in mg/24 hours) patch 21 mg  21 mg Transdermal Daily Mike Craze, MD   21  mg at 01/05/12 0845  . traZODone (DESYREL) tablet 50 mg  50 mg Oral QHS PRN Sanjuana Kava, NP   50 mg at 01/05/12 0114  . DISCONTD: levothyroxine (SYNTHROID, LEVOTHROID) tablet 75 mcg  75 mcg Oral QAC breakfast Sanjuana Kava, NP      . DISCONTD: nicotine polacrilex (NICORETTE) gum 2 mg  2 mg Oral PRN Sanjuana Kava, NP   2 mg at 01/05/12 1610   Facility-Administered Medications Ordered in Other Encounters  Medication Dose Route Frequency Provider Last Rate Last Dose  . LORazepam (ATIVAN) tablet 1 mg  1 mg Oral Once Donnetta Hutching, MD   1 mg at 01/04/12 2057  . DISCONTD: nicotine (NICODERM CQ - dosed in mg/24 hours) patch 21 mg  21 mg Transdermal Once Donnetta Hutching, MD   21 mg at 01/04/12 1932    Lab Results:  Results for orders placed during the hospital encounter of 01/04/12 (from the past 48 hour(s))  PREGNANCY, URINE     Status: Normal   Collection Time   01/04/12  7:03 PM      Component Value Range Comment   Preg Test, Ur NEGATIVE  NEGATIVE    URINE RAPID DRUG SCREEN (HOSP PERFORMED)     Status: Abnormal   Collection Time   01/04/12  7:03  PM      Component Value Range Comment   Opiates NONE DETECTED  NONE DETECTED     Cocaine NONE DETECTED  NONE DETECTED     Benzodiazepines NONE DETECTED  NONE DETECTED     Amphetamines NONE DETECTED  NONE DETECTED     Tetrahydrocannabinol POSITIVE (*) NONE DETECTED     Barbiturates NONE DETECTED  NONE DETECTED    URINALYSIS, ROUTINE W REFLEX MICROSCOPIC     Status: Abnormal   Collection Time   01/04/12  7:03 PM      Component Value Range Comment   Color, Urine AMBER (*) YELLOW  BIOCHEMICALS MAY BE AFFECTED BY COLOR   APPearance CLEAR  CLEAR     Specific Gravity, Urine 1.020  1.005 - 1.030     pH 6.5  5.0 - 8.0     Glucose, UA NEGATIVE  NEGATIVE (mg/dL)    Hgb urine dipstick LARGE (*) NEGATIVE     Bilirubin Urine NEGATIVE  NEGATIVE     Ketones, ur NEGATIVE  NEGATIVE (mg/dL)    Protein, ur NEGATIVE  NEGATIVE (mg/dL)    Urobilinogen, UA 0.2  0.0 - 1.0  (mg/dL)    Nitrite NEGATIVE  NEGATIVE     Leukocytes, UA NEGATIVE  NEGATIVE    URINE MICROSCOPIC-ADD ON     Status: Abnormal   Collection Time   01/04/12  7:03 PM      Component Value Range Comment   Squamous Epithelial / LPF MANY (*) RARE     WBC, UA 3-6  <3 (WBC/hpf)    RBC / HPF 3-6  <3 (RBC/hpf)    Bacteria, UA FEW (*) RARE     Crystals CA OXALATE CRYSTALS (*) NEGATIVE    CBC     Status: Abnormal   Collection Time   01/04/12  7:09 PM      Component Value Range Comment   WBC 10.3  4.0 - 10.5 (K/uL)    RBC 4.96  3.87 - 5.11 (MIL/uL)    Hemoglobin 16.0 (*) 12.0 - 15.0 (g/dL)    HCT 16.1 (*) 09.6 - 46.0 (%)    MCV 92.9  78.0 - 100.0 (fL)    MCH 32.3  26.0 - 34.0 (pg)    MCHC 34.7  30.0 - 36.0 (g/dL)    RDW 04.5  40.9 - 81.1 (%)    Platelets 266  150 - 400 (K/uL)   DIFFERENTIAL     Status: Normal   Collection Time   01/04/12  7:09 PM      Component Value Range Comment   Neutrophils Relative 57  43 - 77 (%)    Neutro Abs 5.8  1.7 - 7.7 (K/uL)    Lymphocytes Relative 33  12 - 46 (%)    Lymphs Abs 3.4  0.7 - 4.0 (K/uL)    Monocytes Relative 8  3 - 12 (%)    Monocytes Absolute 0.8  0.1 - 1.0 (K/uL)    Eosinophils Relative 2  0 - 5 (%)    Eosinophils Absolute 0.2  0.0 - 0.7 (K/uL)    Basophils Relative 0  0 - 1 (%)    Basophils Absolute 0.0  0.0 - 0.1 (K/uL)   COMPREHENSIVE METABOLIC PANEL     Status: Abnormal   Collection Time   01/04/12  7:09 PM      Component Value Range Comment   Sodium 137  135 - 145 (mEq/L)    Potassium 3.7  3.5 - 5.1 (mEq/L)  Chloride 103  96 - 112 (mEq/L)    CO2 22  19 - 32 (mEq/L)    Glucose, Bld 105 (*) 70 - 99 (mg/dL)    BUN 6  6 - 23 (mg/dL)    Creatinine, Ser 1.61  0.50 - 1.10 (mg/dL)    Calcium 09.6  8.4 - 10.5 (mg/dL)    Total Protein 7.6  6.0 - 8.3 (g/dL)    Albumin 4.4  3.5 - 5.2 (g/dL)    AST 20  0 - 37 (U/L)    ALT 15  0 - 35 (U/L)    Alkaline Phosphatase 69  39 - 117 (U/L)    Total Bilirubin 0.5  0.3 - 1.2 (mg/dL)    GFR calc non Af  Amer >90  >90 (mL/min)    GFR calc Af Amer >90  >90 (mL/min)   LIPASE, BLOOD     Status: Normal   Collection Time   01/04/12  7:09 PM      Component Value Range Comment   Lipase 20  11 - 59 (U/L)   TSH     Status: Abnormal   Collection Time   01/04/12  7:09 PM      Component Value Range Comment   TSH 8.922 (*) 0.350 - 4.500 (uIU/mL)     Physical Findings: AIMS:  , ,  ,  ,    CIWA:    COWS:     Treatment Plan Summary: Daily contact with patient to assess and evaluate symptoms and progress in treatment Medication management  Discussion/Plan: Start Tegretol and Celexa for mood control. Attend 12 Step meetings on [address].  [name] [name] 01/05/2012, 3:37 PM
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
## 2012-01-05 NOTE — Progress Notes (Signed)
Brief Nutrition Note  Reason: Patient screened at nutrition risk for unintentional weight loss > 10 lb. Over 1 month  Spoke with patient today. She reported her appetite is better today. She reported she ate mashed potatoes and vegetables for lunch today. Patient has been eating poorly, lunch meal today is progress toward better intake. Patient reported she likes chex-mix and got some for a snack today. She reported that she does not usually think about food or snacks. Patient was without any nutrition related questions. I have encouraged patient to have adequate PO intake of healthy meals and snacks.   Height: 5'9" Weight: 204 lb. BMI: 30.1 kg/m^2 (Obesity class I)  UBW: 225 lb per patient 2 months ago % UBW: 90.6%  *21 lb weight loss over 2 months, 9.3 % from baseline.   Recommend provide patient TID to increase PO intake.  Recommend monitor weight and PO intake as able.  RD available for nutrition needs.  Iven Finn [facility] [phone]

## 2012-01-05 NOTE — Progress Notes (Signed)
Patient ID: [name] [name], female   DOB: 23-Sep-1980, 31 y.o.   MRN: [id number] Pt has been attending groups and interacting with peers and staff.  She requested a change from nicotine gum to patch and feels patch is more helpful.  She denies SI.
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
## 2012-01-05 NOTE — [facility] Suicide Risk Assessment (Addendum)
Suicide Risk Assessment  Admission Assessment     Demographic factors:  Assessment Details Time of Assessment: Admission Information Obtained From: Patient Current Mental Status:  Current Mental Status:  (denies SI) Loss Factors:  Loss Factors: Decrease in vocational status;Loss of significant relationship;Financial problems / change in socioeconomic status Historical Factors:  Historical Factors: Family history of suicide;Family history of mental illness or substance abuse (h/o cutting once in last year) Risk Reduction Factors:  Risk Reduction Factors: Religious beliefs about death;Living with another person, especially a relative;Positive coping skills or problem solving skills  CLINICAL FACTORS:   Depression:   Anhedonia Comorbid alcohol abuse/dependence Hopelessness Alcohol/Substance Abuse/Dependencies Previous Psychiatric Diagnoses and Treatments  COGNITIVE FEATURES THAT CONTRIBUTE TO RISK:  Thought constriction (tunnel vision)    SUICIDE RISK:   Moderate:  Frequent suicidal ideation with limited intensity, and duration, some specificity in terms of plans, no associated intent, good self-control, limited dysphoria/symptomatology, some risk factors present, and identifiable protective factors, including available and accessible social support.  Reason for hospitalization: . Diagnosis:  Axis I: Major Depression, Recurrent severe and Substance Abuse  ADL's:  Intact  Sleep: Poor  Appetite:  Fair  Suicidal Ideation:  Pt denies any suicidal thoughts Homicidal Ideation:  Denies adamantly any homicidal thoughts.  Mental Status Examination/Evaluation: Objective:  Appearance: Casual  Eye Contact::  Good  Speech:  Clear and Coherent  Volume:  Normal  Mood:  Anxious and Depressed  Affect:  Congruent  Thought Process:  Coherent  Orientation:  Full  Thought Content:  WDL  Suicidal Thoughts:  No  Homicidal Thoughts:  No  Memory:  Immediate;   Fair  Judgement:  Impaired    Insight:  Lacking  Psychomotor Activity:  Normal  Concentration:  Fair  Recall:  Fair  Akathisia:  No  Handed:  Right  AIMS (if indicated):     Assets:  Communication Skills Desire for Improvement  Sleep:  Number of Hours: 3.75    Vital Signs:Blood pressure 122/88, pulse 76, temperature 98.1 F (36.7 C), temperature source Oral, resp. rate 17, height 5' 9.5" (1.765 m), weight 92.534 kg (204 lb), last menstrual period 01/04/2012. Current Medications: Current Facility-Administered Medications  Medication Dose Route Frequency Provider Last Rate Last Dose  . acetaminophen (TYLENOL) tablet 650 mg  650 mg Oral Q6H PRN Sanjuana Kava, NP      . alum & mag hydroxide-simeth (MAALOX/MYLANTA) 200-200-20 MG/5ML suspension 30 mL  30 mL Oral Q4H PRN Sanjuana Kava, NP      . carbamazepine (TEGRETOL) tablet 200 mg  200 mg Oral TID Sanjuana Kava, NP      . citalopram (CELEXA) tablet 20 mg  20 mg Oral Daily Sanjuana Kava, NP   20 mg at 01/05/12 1441  . levothyroxine (SYNTHROID, LEVOTHROID) tablet 50 mcg  50 mcg Oral QAC breakfast Sanjuana Kava, NP      . magnesium hydroxide (MILK OF MAGNESIA) suspension 30 mL  30 mL Oral Daily PRN Sanjuana Kava, NP      . nicotine (NICODERM CQ - dosed in mg/24 hours) patch 21 mg  21 mg Transdermal Daily Mike Craze, MD   21 mg at 01/05/12 0845  . traZODone (DESYREL) tablet 50 mg  50 mg Oral QHS PRN Sanjuana Kava, NP   50 mg at 01/05/12 0114  . DISCONTD: levothyroxine (SYNTHROID, LEVOTHROID) tablet 75 mcg  75 mcg Oral QAC breakfast Sanjuana Kava, NP      . DISCONTD: nicotine polacrilex (NICORETTE)  gum 2 mg  2 mg Oral PRN Sanjuana Kava, NP   2 mg at 01/05/12 1610   Facility-Administered Medications Ordered in Other Encounters  Medication Dose Route Frequency Provider Last Rate Last Dose  . LORazepam (ATIVAN) tablet 1 mg  1 mg Oral Once Donnetta Hutching, MD   1 mg at 01/04/12 2057  . DISCONTD: nicotine (NICODERM CQ - dosed in mg/24 hours) patch 21 mg  21 mg Transdermal Once  Donnetta Hutching, MD   21 mg at 01/04/12 1932    Lab Results:  Results for orders placed during the hospital encounter of 01/04/12 (from the past 48 hour(s))  PREGNANCY, URINE     Status: Normal   Collection Time   01/04/12  7:03 PM      Component Value Range Comment   Preg Test, Ur NEGATIVE  NEGATIVE    URINE RAPID DRUG SCREEN (HOSP PERFORMED)     Status: Abnormal   Collection Time   01/04/12  7:03 PM      Component Value Range Comment   Opiates NONE DETECTED  NONE DETECTED     Cocaine NONE DETECTED  NONE DETECTED     Benzodiazepines NONE DETECTED  NONE DETECTED     Amphetamines NONE DETECTED  NONE DETECTED     Tetrahydrocannabinol POSITIVE (*) NONE DETECTED     Barbiturates NONE DETECTED  NONE DETECTED    URINALYSIS, ROUTINE W REFLEX MICROSCOPIC     Status: Abnormal   Collection Time   01/04/12  7:03 PM      Component Value Range Comment   Color, Urine AMBER (*) YELLOW  BIOCHEMICALS MAY BE AFFECTED BY COLOR   APPearance CLEAR  CLEAR     Specific Gravity, Urine 1.020  1.005 - 1.030     pH 6.5  5.0 - 8.0     Glucose, UA NEGATIVE  NEGATIVE (mg/dL)    Hgb urine dipstick LARGE (*) NEGATIVE     Bilirubin Urine NEGATIVE  NEGATIVE     Ketones, ur NEGATIVE  NEGATIVE (mg/dL)    Protein, ur NEGATIVE  NEGATIVE (mg/dL)    Urobilinogen, UA 0.2  0.0 - 1.0 (mg/dL)    Nitrite NEGATIVE  NEGATIVE     Leukocytes, UA NEGATIVE  NEGATIVE    URINE MICROSCOPIC-ADD ON     Status: Abnormal   Collection Time   01/04/12  7:03 PM      Component Value Range Comment   Squamous Epithelial / LPF MANY (*) RARE     WBC, UA 3-6  <3 (WBC/hpf)    RBC / HPF 3-6  <3 (RBC/hpf)    Bacteria, UA FEW (*) RARE     Crystals CA OXALATE CRYSTALS (*) NEGATIVE    CBC     Status: Abnormal   Collection Time   01/04/12  7:09 PM      Component Value Range Comment   WBC 10.3  4.0 - 10.5 (K/uL)    RBC 4.96  3.87 - 5.11 (MIL/uL)    Hemoglobin 16.0 (*) 12.0 - 15.0 (g/dL)    HCT 96.0 (*) 45.4 - 46.0 (%)    MCV 92.9  78.0 - 100.0 (fL)     MCH 32.3  26.0 - 34.0 (pg)    MCHC 34.7  30.0 - 36.0 (g/dL)    RDW 09.8  11.9 - 14.7 (%)    Platelets 266  150 - 400 (K/uL)   DIFFERENTIAL     Status: Normal   Collection Time   01/04/12  7:09 PM      Component Value Range Comment   Neutrophils Relative 57  43 - 77 (%)    Neutro Abs 5.8  1.7 - 7.7 (K/uL)    Lymphocytes Relative 33  12 - 46 (%)    Lymphs Abs 3.4  0.7 - 4.0 (K/uL)    Monocytes Relative 8  3 - 12 (%)    Monocytes Absolute 0.8  0.1 - 1.0 (K/uL)    Eosinophils Relative 2  0 - 5 (%)    Eosinophils Absolute 0.2  0.0 - 0.7 (K/uL)    Basophils Relative 0  0 - 1 (%)    Basophils Absolute 0.0  0.0 - 0.1 (K/uL)   COMPREHENSIVE METABOLIC PANEL     Status: Abnormal   Collection Time   01/04/12  7:09 PM      Component Value Range Comment   Sodium 137  135 - 145 (mEq/L)    Potassium 3.7  3.5 - 5.1 (mEq/L)    Chloride 103  96 - 112 (mEq/L)    CO2 22  19 - 32 (mEq/L)    Glucose, Bld 105 (*) 70 - 99 (mg/dL)    BUN 6  6 - 23 (mg/dL)    Creatinine, Ser 8.29  0.50 - 1.10 (mg/dL)    Calcium 56.2  8.4 - 10.5 (mg/dL)    Total Protein 7.6  6.0 - 8.3 (g/dL)    Albumin 4.4  3.5 - 5.2 (g/dL)    AST 20  0 - 37 (U/L)    ALT 15  0 - 35 (U/L)    Alkaline Phosphatase 69  39 - 117 (U/L)    Total Bilirubin 0.5  0.3 - 1.2 (mg/dL)    GFR calc non Af Amer >90  >90 (mL/min)    GFR calc Af Amer >90  >90 (mL/min)   LIPASE, BLOOD     Status: Normal   Collection Time   01/04/12  7:09 PM      Component Value Range Comment   Lipase 20  11 - 59 (U/L)   TSH     Status: Abnormal   Collection Time   01/04/12  7:09 PM      Component Value Range Comment   TSH 8.922 (*) 0.350 - 4.500 (uIU/mL)     Physical Findings: AIMS:  , ,  ,  ,    CIWA:    COWS:     Risk: Risk of harm to self is elevated by her depression, anxiety, and addictions, but already sees that she has has herself to live for.  Risk of harm to others is minimal in that she has not been involved in fights or had any legal charges filed on  her.  Treatment Plan Summary: Daily contact with patient to assess and evaluate symptoms and progress in treatment Medication management  Discussion/Plan: Attend 12 Step meetings on [address]. We will admit the patient for crisis stabilization and treatment. I talked to pt about starting Tegretol and celexa for for mood dyscontrol and depression. I explained the risks and benefits of medication in detail.  We will continue on q. 15 checks the unit protocol. At this time there is no clinical indication for one-to-one observation as patient contract for safety and presents little risk to harm themself and others.  We will increase collateral information. I encourage patient to participate in group milieu therapy. Pt will be seen in treatment team soon for further treatment and appropriate discharge planning. Please see  history and physical note for more detailed information ELOS: 3 to 5 days.   Kandra Graven 01/05/2012, 3:42 PM

## 2012-01-05 NOTE — Discharge Instructions (Addendum)
"  [place]" compiled by [employer] could be helpful.  "Native Wisdom for [employer] by Camelia Phenes could be very helpful.  Attend 90 meetings in 90 days. Get trusted sponsor from the advise of others or from whomever in meetings seems to make sense, has a proven track record, will hold you responsible for your sobriety, and both expects and insists on total abstinence.  Work the steps HONESTLY with the trusted sponsor. Get obsessed with your recovery by often reminding yourself of how DEADLY this dredged horrible disease of addiction JUST IS. Focus the first month on speaker meetings where you specifically look at how your life has been wrecked by drugs/alcohol and how your life has been similar to that of the speakers.  The 1998 movie called "Smoke Signals" is very helpful in understanding alcoholism in the Native [place] culture.  Could use "Move Free" or "Osteo bi Flex" for arthritic pain.   The important ingredients are Chondrotin Sulfate and Glucosamine.

## 2012-01-05 NOTE — Progress Notes (Signed)
[facility] Group Notes:  (Counselor/Nursing/MHT/Case Management/Adjunct)  01/05/2012 10:00a.m  Type of Therapy:  Grief Loss  Participation Level:  Minimal  Participation Quality:  Attentive  Affect:  Depressed  Cognitive:  Appropriate  Insight:  Limited  Engagement in Group:  Limited  Engagement in Therapy:  Limited  Modes of Intervention:  Support  Summary of Progress/Problems: Pt briefly discussed the losses that she has suffered in her life, yet she stated that she [facility] Group Notes:  (Counselor/Nursing/MHT/Case Management/Adjunct)  01/05/2012 1:15pm  Type of Therapy:  Group Therapy  Participation Level:  Active  Participation Quality:  Appropriate  Affect:  Appropriate  Cognitive:  Alert  Insight:  Good  Engagement in Group:  Good  Engagement in Therapy:  Good  Modes of Intervention:  Support  Summary of Progress/Problems: Pt was able to engage in the process group "Feelings about Diagnosis," and stated that she felt better knowing that she was not the only one that felt like she did.  She stated that the more she hears other people talk about their problems it helps her to feel better.   Estevan Ryder Renee 01/05/2012, 3:09 PMdid not feel like talking much since it was her first group    Calvert Cantor 01/05/2012, 3:06 PM

## 2012-01-05 NOTE — Tx Team (Addendum)
Initial Interdisciplinary Treatment Plan  PATIENT STRENGTHS: (choose at least two) Ability for insight Average or above average intelligence Capable of independent living Communication skills General fund of knowledge Motivation for treatment/growth Physical Health Religious Affiliation Supportive family/friends Work skills  PATIENT STRESSORS: Educational concerns Financial difficulties Marital or family conflict Occupational concerns   PROBLEM LIST: Problem List/Patient Goals Date to be addressed Date deferred Reason deferred Estimated date of resolution  Depression       Anxiety                                                 DISCHARGE CRITERIA:  Ability to meet basic life and health needs Adequate post-discharge living arrangements Improved stabilization in mood, thinking, and/or behavior Motivation to continue treatment in a less acute level of care Need for constant or close observation no longer present Reduction of life-threatening or endangering symptoms to within safe limits Safe-care adequate arrangements made Verbal commitment to aftercare and medication compliance  PRELIMINARY DISCHARGE PLAN: Outpatient therapy Return to previous living arrangement Return to previous work or school arrangements  PATIENT/FAMIILY INVOLVEMENT: This treatment plan has been presented to and reviewed with the patient, [name] [name].  The patient has been given the opportunity to ask questions and make suggestions.  Arturo Morton 01/05/2012, 12:32 AM

## 2012-01-05 NOTE — Progress Notes (Signed)
[facility] Group Notes:  (Counselor/Nursing/MHT/Case Management/Adjunct)  01/05/2012 11:00am  Type of Therapy:  Grief Loss  Participation Level:  Minimal  Participation Quality:  Attentive  Affect:  Depressed  Cognitive:  Appropriate  Insight:  Limited  Engagement in Group:  Limited  Engagement in Therapy:  Limited  Modes of Intervention:  Support  Summary of Progress/Problems:  Pt stated that she did not want to talk much since it was her first day in the hospital.   Estevan Ryder Renee 01/05/2012, 3:50 PM

## 2012-01-05 NOTE — Progress Notes (Signed)
Was in dayroom, watching TV with peers on approach. Appears much brighter vs Adm. Calm and cooperative with assessment. No acute distress noted. States she has had a very good day and feels like her mood is much improved. States she was able to identify her mental illness and was started on new medication for mood and depression to which she attributes her immediate improvement. States she was feeling shy and reserved at the beginning of the day, but after attending groups she felt more comfortable and willing to open up. Support and encouragement provided. Did have some questions about medication names. Writer able to clarify names, indications, dosages and potential side effects. Denies pain/discomfort. Denies SI/HI/AVH and contracts for safety. POC and medications reviewed and understanding verbalized. Safety has been maintained with Q15 minute observation. Will continue current POC.

## 2012-01-05 NOTE — Tx Team (Signed)
Interdisciplinary Treatment Plan Update (Adult)  Date:  01/05/2012  Time Reviewed:  11:02 AM   Progress in Treatment: Attending groups:   Yes   Participating in groups:  Yes Taking medication as prescribed:  Yes Tolerating medication:  Yes Family/Significant othe contact made: Counselor to assess for contact with family Patient understands diagnosis:  Yes Discussing patient identified problems/goals with staff: Yes Medical problems stabilized or resolved: Yes Denies suicidal/homicidal ideation:Yes Issues/concerns per patient self-inventory:  Other:  New problem(s) identified:  Reason for Continuation of Hospitalization: Anxiety Depression Medication stabilization  Interventions implemented related to continuation of hospitalization:  Additional comments:  Estimated length of stay: 2-3 days  Discharge Plan:  Home with outpatient follow up  New goal(s):  Review of initial/current patient goals per problem list:   1.  Goal(s):  Eliminate SI/thoughts of self harm  Met:  Yes  Target date: d/c  As evidenced by:  Patient no longer endorsing SI  2.  Goal (s):  Reduce depression/anxiety (currently rated at eight and six)  Met:  No  Target date: d/c  As evidenced by:  Patient will rate symptoms at four or below  3.  Goal(s):  Stabilize on medications  Met:  No  Target date: d/c  As evidenced by:  Patient will report being stable on medications  4.  Goal(s):  Refer for outpatient follow up  Met:  No  Target date: d/c  As evidenced by: Follow up appointment will be in place  Attendees: Patient:     Family:     Physician:  Orson Aloe, MD 01/05/2012 11:02 AM   Nursing:   Neill Loft, RN 01/05/2012 11:02 AM   CaseManager:  Juline Patch, LCSW 01/05/2012 11:02 AM   Counselor:  Nori Riis, LCSW 01/05/2012 11:02 AM   Other:   01/05/2012 11:02 AM   Other:  Reyes Ivan, LCSWA 01/05/2012  11:02 AM   Other:  Ellison Hughs, Pharm D 01/05/2012 11:03 AM   Other:        Scribe for Treatment Team:   Wynn Banker, LCSW,  01/05/2012 11:02 AM

## 2012-01-05 NOTE — Progress Notes (Signed)
Patient ID: [name] [name], female   DOB: 12-Nov-1980, 31 y.o.   MRN: [id number] Pt reports she was able to eat at lunch today and she had mashed potatoes and vegetables.  Talked with pt about making healthy choices since he appetite is limited.

## 2012-01-05 NOTE — H&P (Signed)
Medical/psychiatric screening examination/treatment/procedure(s) were performed by non-physician practitioner and as supervising physician I was immediately available for consultation/collaboration.  I have seen and examined this patient and agree the major elements of this evaluation.  

## 2012-01-06 DIAGNOSIS — F339 Major depressive disorder, recurrent, unspecified: Secondary | ICD-10-CM

## 2012-01-06 LAB — T4, FREE: Free T4: 1.08 ng/dL (ref 0.80–1.80)

## 2012-01-06 NOTE — Progress Notes (Signed)
D:  Pt attended AA group this evening.  A:  Encouraged pt to attend groups that support her treatment goals.  R:  Pt is safe. Aundria Rud, Elinore Shults L, MHT/NS

## 2012-01-06 NOTE — Progress Notes (Signed)
01/06/2012         Time: 1500      Group Topic/Focus: The focus of this group is on discussing various styles of communication and communicating assertively using 'I' (feeling) statements.  Participation Level: Active  Participation Quality: Appropriate  Affect: Appropriate  Cognitive: Approprite   Additional Comments: None.   Kaan Tosh 01/06/2012 3:46 PM

## 2012-01-06 NOTE — Progress Notes (Signed)
Pt rates depression at a 2 and hopelessness at a 2. Pt attends groups and interacts well with peers and staff. Pt was offered support and encouragement. Pt denies SI/HI. Pt is receptive to treatment and safety is maintained on unit.

## 2012-01-06 NOTE — Progress Notes (Signed)
[facility] Group Notes: (Counselor/Nursing/MHT/Case Management/Adjunct) 01/06/2012    11:00am Emotion Regulation  Type of Therapy:  Group Therapy  Participation Level:  Limited  Participation Quality:  Attentive, Sharing   Affect:  Appropriate  Cognitive:  Appropriate  Insight:  Limited  Engagement in Group: Limited  Engagement in Therapy:  Limited  Modes of Intervention:  Support and Exploration  Summary of Progress/Problems:  [name] [name] was attentive and seemed to relate well to others who discussed getting overwhelmed by their emotions. She was able to identify what anger feels like in her body. [name] [name] gave good input to other group members, stating that she is able to replace negative behaviors with positive ones like going fishing or taking a walk when she feels angry so that she does not respond destructively.  Billie Lade 01/06/2012  1:12 PM

## 2012-01-06 NOTE — Progress Notes (Signed)
[facility] Group Notes: (Counselor/Nursing/MHT/Case Management/Adjunct) 01/06/2012   @1 :15pm Coping with Anxiety:  Using Self-Talk, Breathing Techniques, Muscle Relaxation and Other Tactics  Type of Therapy:  Group Therapy  Participation Level:  Active  Participation Quality: Attentive, Sharing, Appropriate   Affect:  Appropriate  Cognitive:  Appropriate  Insight:  Good  Engagement in Group: Good  Engagement in Therapy:  Good  Modes of Intervention:  Support and Exploration  Summary of Progress/Problems: [name] [name] was very active in the group. She explored her experience of anxiety, saying that she ruminates a lot and feels suffocated by her anxiety at times. She took part in relaxation exercises and stated that she was able to use them to release painful tension in her body. [name] [name] is very knowledgeable about the connections between emotions and physiological sensations. She talked about dealing with anger and anxiety by going into her car and listening to music. The method she described in based in mindfulness, and she was very receptive concepts of mindfulness and grounding as a way to combat anxiety.  Billie Lade 01/06/2012 4:24 PM
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
## 2012-01-06 NOTE — Progress Notes (Signed)
[facility] MD Progress Note  01/06/2012 9:46 AM  Diagnosis:  Axis I: Major Depression, Recurrent severe and Substance Abuse  ADL's:  Intact  Sleep: Poor  Appetite:  Fair  Suicidal Ideation:  Pt denies any suicidal thoughts Homicidal Ideation:  Denies adamantly any homicidal thoughts.  Mental Status Examination/Evaluation: Objective:  Appearance: Casual  Eye Contact::  Good  Speech:  Clear and Coherent  Volume:  Normal  Mood:  Anxious and Depressed  Affect:  Congruent  Thought Process:  Coherent  Orientation:  Full  Thought Content:  WDL  Suicidal Thoughts:  No  Homicidal Thoughts:  No  Memory:  Immediate;   Fair  Judgement:  Impaired  Insight:  Lacking  Psychomotor Activity:  Normal  Concentration:  Fair  Recall:  Fair  Akathisia:  No  Handed:  Right  AIMS (if indicated):     Assets:  Communication Skills Desire for Improvement  Sleep:  Number of Hours: 6.75    ROS: Neuro: no headaches, ataxia, weakness  GI: no N/V/D/cramps/constipation  MS: no weakness, muscle cramps, but knees ache.   Vital Signs:Blood pressure 122/88, pulse 76, temperature 98.1 F (36.7 C), temperature source Oral, resp. rate 17, height 5' 9.5" (1.765 m), weight 92.534 kg (204 lb), last menstrual period 01/04/2012. Current Medications: Current Facility-Administered Medications  Medication Dose Route Frequency Provider Last Rate Last Dose  . acetaminophen (TYLENOL) tablet 650 mg  650 mg Oral Q6H PRN Sanjuana Kava, NP      . alum & mag hydroxide-simeth (MAALOX/MYLANTA) 200-200-20 MG/5ML suspension 30 mL  30 mL Oral Q4H PRN Sanjuana Kava, NP      . carbamazepine (TEGRETOL) tablet 200 mg  200 mg Oral TID Sanjuana Kava, NP   200 mg at 01/06/12 0815  . citalopram (CELEXA) tablet 20 mg  20 mg Oral Daily Sanjuana Kava, NP   20 mg at 01/06/12 0815  . levothyroxine (SYNTHROID, LEVOTHROID) tablet 50 mcg  50 mcg Oral QAC breakfast Sanjuana Kava, NP   50 mcg at 01/06/12 0617  . magnesium hydroxide (MILK OF  MAGNESIA) suspension 30 mL  30 mL Oral Daily PRN Sanjuana Kava, NP      . nicotine (NICODERM CQ - dosed in mg/24 hours) patch 21 mg  21 mg Transdermal Daily Mike Craze, MD   21 mg at 01/06/12 0617  . traZODone (DESYREL) tablet 50 mg  50 mg Oral QHS PRN Sanjuana Kava, NP   50 mg at 01/05/12 2203  . DISCONTD: levothyroxine (SYNTHROID, LEVOTHROID) tablet 75 mcg  75 mcg Oral QAC breakfast Sanjuana Kava, NP        Lab Results:  Results for orders placed during the hospital encounter of 01/04/12 (from the past 48 hour(s))  PREGNANCY, URINE     Status: Normal   Collection Time   01/04/12  7:03 PM      Component Value Range Comment   Preg Test, Ur NEGATIVE  NEGATIVE    URINE RAPID DRUG SCREEN (HOSP PERFORMED)     Status: Abnormal   Collection Time   01/04/12  7:03 PM      Component Value Range Comment   Opiates NONE DETECTED  NONE DETECTED     Cocaine NONE DETECTED  NONE DETECTED     Benzodiazepines NONE DETECTED  NONE DETECTED     Amphetamines NONE DETECTED  NONE DETECTED     Tetrahydrocannabinol POSITIVE (*) NONE DETECTED     Barbiturates NONE DETECTED  NONE DETECTED  URINALYSIS, ROUTINE W REFLEX MICROSCOPIC     Status: Abnormal   Collection Time   01/04/12  7:03 PM      Component Value Range Comment   Color, Urine AMBER (*) YELLOW  BIOCHEMICALS MAY BE AFFECTED BY COLOR   APPearance CLEAR  CLEAR     Specific Gravity, Urine 1.020  1.005 - 1.030     pH 6.5  5.0 - 8.0     Glucose, UA NEGATIVE  NEGATIVE (mg/dL)    Hgb urine dipstick LARGE (*) NEGATIVE     Bilirubin Urine NEGATIVE  NEGATIVE     Ketones, ur NEGATIVE  NEGATIVE (mg/dL)    Protein, ur NEGATIVE  NEGATIVE (mg/dL)    Urobilinogen, UA 0.2  0.0 - 1.0 (mg/dL)    Nitrite NEGATIVE  NEGATIVE     Leukocytes, UA NEGATIVE  NEGATIVE    URINE MICROSCOPIC-ADD ON     Status: Abnormal   Collection Time   01/04/12  7:03 PM      Component Value Range Comment   Squamous Epithelial / LPF MANY (*) RARE     WBC, UA 3-6  <3 (WBC/hpf)    RBC /  HPF 3-6  <3 (RBC/hpf)    Bacteria, UA FEW (*) RARE     Crystals CA OXALATE CRYSTALS (*) NEGATIVE    CBC     Status: Abnormal   Collection Time   01/04/12  7:09 PM      Component Value Range Comment   WBC 10.3  4.0 - 10.5 (K/uL)    RBC 4.96  3.87 - 5.11 (MIL/uL)    Hemoglobin 16.0 (*) 12.0 - 15.0 (g/dL)    HCT 16.1 (*) 09.6 - 46.0 (%)    MCV 92.9  78.0 - 100.0 (fL)    MCH 32.3  26.0 - 34.0 (pg)    MCHC 34.7  30.0 - 36.0 (g/dL)    RDW 04.5  40.9 - 81.1 (%)    Platelets 266  150 - 400 (K/uL)   DIFFERENTIAL     Status: Normal   Collection Time   01/04/12  7:09 PM      Component Value Range Comment   Neutrophils Relative 57  43 - 77 (%)    Neutro Abs 5.8  1.7 - 7.7 (K/uL)    Lymphocytes Relative 33  12 - 46 (%)    Lymphs Abs 3.4  0.7 - 4.0 (K/uL)    Monocytes Relative 8  3 - 12 (%)    Monocytes Absolute 0.8  0.1 - 1.0 (K/uL)    Eosinophils Relative 2  0 - 5 (%)    Eosinophils Absolute 0.2  0.0 - 0.7 (K/uL)    Basophils Relative 0  0 - 1 (%)    Basophils Absolute 0.0  0.0 - 0.1 (K/uL)   COMPREHENSIVE METABOLIC PANEL     Status: Abnormal   Collection Time   01/04/12  7:09 PM      Component Value Range Comment   Sodium 137  135 - 145 (mEq/L)    Potassium 3.7  3.5 - 5.1 (mEq/L)    Chloride 103  96 - 112 (mEq/L)    CO2 22  19 - 32 (mEq/L)    Glucose, Bld 105 (*) 70 - 99 (mg/dL)    BUN 6  6 - 23 (mg/dL)    Creatinine, Ser 9.14  0.50 - 1.10 (mg/dL)    Calcium 78.2  8.4 - 10.5 (mg/dL)    Total Protein 7.6  6.0 -  8.3 (g/dL)    Albumin 4.4  3.5 - 5.2 (g/dL)    AST 20  0 - 37 (U/L)    ALT 15  0 - 35 (U/L)    Alkaline Phosphatase 69  39 - 117 (U/L)    Total Bilirubin 0.5  0.3 - 1.2 (mg/dL)    GFR calc non Af Amer >90  >90 (mL/min)    GFR calc Af Amer >90  >90 (mL/min)   LIPASE, BLOOD     Status: Normal   Collection Time   01/04/12  7:09 PM      Component Value Range Comment   Lipase 20  11 - 59 (U/L)   TSH     Status: Abnormal   Collection Time   01/04/12  7:09 PM      Component Value  Range Comment   TSH 8.922 (*) 0.350 - 4.500 (uIU/mL)     Physical Findings: AIMS:  , ,  ,  ,    CIWA:    COWS:     Treatment Plan Summary: Daily contact with patient to assess and evaluate symptoms and progress in treatment Medication management  Discussion/Plan: Continue current plan.  Will need to get Teg level in a couple of days.   Strongly consider referral to residential care.  Keyetta Hollingworth 01/06/2012, 9:46 AM

## 2012-01-07 MED ORDER — LEVOTHYROXINE SODIUM 25 MCG PO TABS: 25.0000 ug | ORAL_TABLET | Freq: Every day | ORAL | Status: DC

## 2012-01-07 MED ORDER — CARBAMAZEPINE 200 MG PO TABS: 200.0000 mg | ORAL_TABLET | Freq: Three times a day (TID) | ORAL | Status: DC

## 2012-01-07 MED ORDER — CITALOPRAM HYDROBROMIDE 20 MG PO TABS: 20.0000 mg | ORAL_TABLET | Freq: Every day | ORAL | Status: DC

## 2012-01-07 MED ORDER — NICOTINE 21 MG/24HR TD PT24: 1.0000 | MEDICATED_PATCH | Freq: Every day | TRANSDERMAL | Status: AC

## 2012-01-07 NOTE — Progress Notes (Signed)
[facility] Case Management Discharge Plan:  Will you be returning to the same living situation after discharge: Yes,  Patient will be returning to the homes she shares with family At discharge, do you have transportation home?:Yes,  Patient will arrange for family to transport her home Do you have the ability to pay for your medications:No.  Patient to be assisted with indigent medications  Interagency Information:     Release of information consent forms completed and in the chart;  Patient's signature needed at discharge.  Patient to Follow up at:  Follow-up Information    Follow up with      [facility]. (You are scheduled to see)    Contact information:   [address] [place], [place]  [address]  [phone]         Patient denies SI/HI:   Yes,  [place] is not endorsing SI    Safety Planning and Suicide Prevention discussed:  Yes,  Reviewed with patient individually  Barrier to discharge identified:Yes,  Lack of employment, financial  Summary and Recommendations:  Encouraged to be compliant with medications and follow up with outpatient provider   Wynn Banker 01/07/2012, 9:40 AM
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
## 2012-01-07 NOTE — Progress Notes (Signed)
[facility] MD Progress Note  01/07/2012 2:33 PM  Diagnosis:  Axis I: Major Depression, Recurrent severe and Substance Abuse  ADL's:  Intact  Sleep: Good  Appetite:  Fair  Suicidal Ideation:  Pt denies any suicidal thoughts Homicidal Ideation:  Denies adamantly any homicidal thoughts.  Mental Status Examination/Evaluation: Objective:  Appearance: Casual  Eye Contact::  Good  Speech:  Clear and Coherent  Volume:  Normal  Mood:  Anxious and Depressed  Affect:  Congruent  Thought Process:  Coherent  Orientation:  Full  Thought Content:  WDL  Suicidal Thoughts:  No  Homicidal Thoughts:  No  Memory:  Immediate;   Good  Judgement:  Good  Insight:  Good  Psychomotor Activity:  Normal  Concentration:  Good  Recall:  Good  Akathisia:  No  Handed:  Right  AIMS (if indicated):     Assets:  Communication Skills Desire for Improvement  Sleep:  Number of Hours: 5.75    ROS: Neuro: denies headaches, ataxia, weakness  GI: denies N/V/D/cramps/constipation  MS: denies weakness, muscle cramps, but knees ache.   Vital Signs:Blood pressure 134/86, pulse 72, temperature 96.8 F (36 C), temperature source Oral, resp. rate 15, height 5' 9.5" (1.765 m), weight 92.534 kg (204 lb), last menstrual period 01/04/2012. Current Medications: Current Facility-Administered Medications  Medication Dose Route Frequency Provider Last Rate Last Dose  . acetaminophen (TYLENOL) tablet 650 mg  650 mg Oral Q6H PRN Sanjuana Kava, NP      . alum & mag hydroxide-simeth (MAALOX/MYLANTA) 200-200-20 MG/5ML suspension 30 mL  30 mL Oral Q4H PRN Sanjuana Kava, NP      . carbamazepine (TEGRETOL) tablet 200 mg  200 mg Oral TID Sanjuana Kava, NP   200 mg at 01/07/12 1141  . citalopram (CELEXA) tablet 20 mg  20 mg Oral Daily Sanjuana Kava, NP   20 mg at 01/07/12 0742  . levothyroxine (SYNTHROID, LEVOTHROID) tablet 50 mcg  50 mcg Oral QAC breakfast Sanjuana Kava, NP   50 mcg at 01/07/12 [phone]  . magnesium hydroxide (MILK OF  MAGNESIA) suspension 30 mL  30 mL Oral Daily PRN Sanjuana Kava, NP      . nicotine (NICODERM CQ - dosed in mg/24 hours) patch 21 mg  21 mg Transdermal Daily Mike Craze, MD   21 mg at 01/06/12 0617  . traZODone (DESYREL) tablet 50 mg  50 mg Oral QHS PRN Sanjuana Kava, NP   50 mg at 01/05/12 2203    Lab Results:  Results for orders placed during the hospital encounter of 01/04/12 (from the past 48 hour(s))  T3, FREE     Status: Normal   Collection Time   01/06/12  6:25 AM      Component Value Range Comment   T3, Free 3.5  2.3 - 4.2 (pg/mL)   T4, FREE     Status: Normal   Collection Time   01/06/12  6:25 AM      Component Value Range Comment   Free T4 1.08  0.80 - 1.80 (ng/dL)   CARBAMAZEPINE LEVEL, TOTAL     Status: Normal   Collection Time   01/07/12  6:10 AM      Component Value Range Comment   Carbamazepine Lvl 7.5  4.0 - 12.0 (ug/mL)     Physical Findings: AIMS:  , ,  ,  ,    CIWA:    COWS:     Treatment Plan Summary: Daily contact with patient  to assess and evaluate symptoms and progress in treatment Medication management  Discussion/Plan: D/C home today.   Has plans to attend 12 Step meetings.  [name] [name] 01/07/2012, 2:33 PM

## 2012-01-07 NOTE — Progress Notes (Signed)
Pt D/C home. Pt rx and follow-up visit discussed and pt verbalized understanding. Pt denies SI/HI. Pt belongings were returned.

## 2012-01-07 NOTE — Tx Team (Addendum)
Interdisciplinary Treatment Plan Update (Adult)  Date:  01/07/2012  Time Reviewed:  10:45 AM   Progress in Treatment: Attending groups: Yes Participating in groups:  Yes, very engaged and insightful in group Taking medication as prescribed:  Yes Tolerating medication: Yes Family/Significant othe contact made:  Yes, contact made with: mother, Vernona Rieger Patient understands diagnosis: Yes Discussing patient identified problems/goals with staff:  Yes Medical problems stabilized or resolved: Yes Denies suicidal/homicidal ideation: Yes Issues/concerns per patient self-inventory:  No  Other:  New problem(s) identified: None  Reason for Continuation of Hospitalization: Appropriate for discharge today  Interventions implemented related to continuation of hospitalization:  Medication stabilization, safety checks q 15 mins, group attendance  Additional comments:  Estimated length of stay: discharge today  Discharge Plan: Discharge home and follow up with [facility]  New goal(s):  Review of initial/current patient goals per problem list:   1. Goal(s): Eliminate SI/thoughts of self harm  Met: Yes  Target date: d/c  As evidenced by: Lanora Manis denies suicidal thoughts 2. Goal (s): Reduce depression/anxiety to rating of 4 or less Met: Yes Target date: d/c  As evidenced by: Lanora Manis rates depression and anxiety at 0 today 3. Goal(s): Stabilize on medications  Met: Yes Target date: d/c  As evidenced by: Lanora Manis reports medications have worked to decrease symptoms with no intolerable side effects 4. Goal(s): Refer for outpatient follow up  Met: Yes Target date: d/c  As evidenced by: [facility]    Attendees: Patient:  [name] [name] 01/07/2012 10:45 AM  Family:     Physician:  Dr Orson Aloe, MD 01/07/2012 10:45 AM  Nursing:   Omelia Blackwater, RN 01/07/2012 10:45 AM  Case Manager:  Juline Patch, LCSW 01/07/2012 10:45 AM    Counselor:  Angus Palms, LCSW 01/07/2012 10:45 AM  Other:  Reyes Ivan, LCSWA 01/07/2012 10:45 AM  Other:  Izola Price, RN 01/07/2012 10:45 AM  Other:  Barrie Folk, RN 01/07/2012 10:45 AM  Other:      Scribe for Treatment Team:   Billie Lade, 01/07/2012 10:45 AM
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
## 2012-01-07 NOTE — Progress Notes (Signed)
[facility] Family/Significant Other Suicide Prevention Education  Suicide Prevention Education:  Education Completed; [name] [name], mother [phone]) has been identified by the patient as the family member/significant other with whom the patient will be residing, and identified as the person(s) who will aid the patient in the event of a mental health crisis (suicidal ideations/suicide attempt).  With written consent from the patient, the family member/significant other has been provided the following suicide prevention education, prior to the and/or following the discharge of the patient.  The suicide prevention education provided includes the following:  Suicide risk factors  Suicide prevention and interventions  National Suicide Hotline telephone number  [facility] assessment telephone number  [facility] Emergency Assistance 911  [facility] and/or Residential Mobile Crisis Unit telephone number  Request made of family/significant other to:  Remove weapons (e.g., guns, rifles, knives), all items previously/currently identified as safety concern.    Remove drugs/medications (over-the-counter, prescriptions, illicit drugs), all items previously/currently identified as a safety concern.  [name] [name] reported no concerns about safety related to [name] [name] being discharged today. She stated that [name] [name] does not have access to firearms, and accepted information about crisis contact information. [name] [name] verbalized understanding of suicide prevention information and had no further questions.  Billie Lade 01/07/2012, 2:26 PM
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
## 2012-01-08 NOTE — Progress Notes (Signed)
Patient Discharge Instructions:  After Visit Summary (AVS):   Faxed to:  01/08/2012 Face Sheet:   Faxed to:  01/08/2012 Psychiatric Admission Assessment Note:   Faxed to:  01/08/2012 Suicide Risk Assessment - Discharge Assessment:   Faxed to:  01/08/2012 Faxed/Sent to the Next Level Care provider:  01/08/2012  Faxed to [facility] @ [phone]  Wandra Scot, 01/08/2012, 6:14 PM

## 2012-01-12 NOTE — Discharge Summary (Signed)
I agree with this D/C Summary.  

## 2012-01-13 LAB — CARBAMAZEPINE LEVEL, TOTAL: Carbamazepine Lvl: 2.4 ug/mL — ABNORMAL LOW (ref 4.0–12.0)

## 2012-11-30 ENCOUNTER — Other Ambulatory Visit (HOSPITAL_COMMUNITY): Payer: Self-pay | Admitting: Family Medicine

## 2012-11-30 DIAGNOSIS — E039 Hypothyroidism, unspecified: Secondary | ICD-10-CM

## 2012-12-02 ENCOUNTER — Ambulatory Visit (HOSPITAL_COMMUNITY)
Admission: RE | Admit: 2012-12-02 | Discharge: 2012-12-02 | Disposition: A | Discharge status: Home / Self Care | Payer: BC Managed Care – PPO | Source: Ambulatory Visit | Attending: Family Medicine | Admitting: Family Medicine

## 2012-12-02 DIAGNOSIS — E041 Nontoxic single thyroid nodule: Secondary | ICD-10-CM | POA: Insufficient documentation

## 2012-12-02 DIAGNOSIS — E039 Hypothyroidism, unspecified: Secondary | ICD-10-CM

## 2013-02-02 ENCOUNTER — Ambulatory Visit (HOSPITAL_COMMUNITY): Payer: Self-pay | Admitting: Psychiatry

## 2013-02-08 ENCOUNTER — Ambulatory Visit (INDEPENDENT_AMBULATORY_CARE_PROVIDER_SITE_OTHER): Payer: BC Managed Care – PPO | Admitting: Psychiatry

## 2013-02-08 ENCOUNTER — Encounter (HOSPITAL_COMMUNITY): Payer: Self-pay | Admitting: Psychiatry

## 2013-02-08 VITALS — BP 112/83 | HR 74 | Ht 69.0 in | Wt 222.6 lb

## 2013-02-08 DIAGNOSIS — F4310 Post-traumatic stress disorder, unspecified: Secondary | ICD-10-CM

## 2013-02-08 DIAGNOSIS — F411 Generalized anxiety disorder: Secondary | ICD-10-CM

## 2013-02-08 DIAGNOSIS — F3130 Bipolar disorder, current episode depressed, mild or moderate severity, unspecified: Secondary | ICD-10-CM

## 2013-02-08 DIAGNOSIS — F4010 Social phobia, unspecified: Secondary | ICD-10-CM

## 2013-02-08 DIAGNOSIS — E559 Vitamin D deficiency, unspecified: Secondary | ICD-10-CM

## 2013-02-08 DIAGNOSIS — F429 Obsessive-compulsive disorder, unspecified: Secondary | ICD-10-CM

## 2013-02-08 MED ORDER — PROPRANOLOL HCL 10 MG PO TABS: 10.0000 mg | ORAL_TABLET | Freq: Three times a day (TID) | ORAL | Status: DC

## 2013-02-08 MED ORDER — CARBAMAZEPINE 200 MG PO TABS: 200.0000 mg | ORAL_TABLET | Freq: Three times a day (TID) | ORAL | Status: DC

## 2013-02-08 MED ORDER — CITALOPRAM HYDROBROMIDE 20 MG PO TABS: 20.0000 mg | ORAL_TABLET | Freq: Every day | ORAL | Status: DC
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
## 2013-02-08 NOTE — Patient Instructions (Addendum)
Try Inderal for social anxiety  Get labs before next appointment  Call if problems or concerns.

## 2013-04-12 ENCOUNTER — Other Ambulatory Visit (HOSPITAL_COMMUNITY): Payer: Self-pay | Admitting: Psychiatry

## 2013-04-24 ENCOUNTER — Other Ambulatory Visit (HOSPITAL_COMMUNITY): Payer: Self-pay | Admitting: Psychiatry

## 2013-04-25 ENCOUNTER — Ambulatory Visit (HOSPITAL_COMMUNITY): Payer: BC Managed Care – PPO | Admitting: Psychiatry

## 2013-05-09 ENCOUNTER — Other Ambulatory Visit (HOSPITAL_COMMUNITY): Payer: Self-pay | Admitting: Psychiatry

## 2013-05-09 NOTE — Telephone Encounter (Signed)
Dr walker is no longer in the office. If patient needs medication, should contact at [phone] and get appointment with new provider.

## 2013-05-10 ENCOUNTER — Telehealth (HOSPITAL_COMMUNITY): Payer: Self-pay | Admitting: *Deleted

## 2013-05-10 ENCOUNTER — Telehealth (HOSPITAL_COMMUNITY): Payer: Self-pay | Admitting: Psychiatry

## 2013-05-10 NOTE — Telephone Encounter (Signed)
30 day supply faxed, needs appt

## 2013-05-11 ENCOUNTER — Ambulatory Visit (INDEPENDENT_AMBULATORY_CARE_PROVIDER_SITE_OTHER): Payer: BC Managed Care – PPO | Admitting: Psychiatry

## 2013-05-11 ENCOUNTER — Telehealth (HOSPITAL_COMMUNITY): Payer: Self-pay | Admitting: Physician Assistant

## 2013-05-11 ENCOUNTER — Encounter (HOSPITAL_COMMUNITY): Payer: Self-pay | Admitting: Psychiatry

## 2013-05-11 VITALS — BP 110/80 | Ht 69.0 in | Wt 215.0 lb

## 2013-05-11 DIAGNOSIS — F3130 Bipolar disorder, current episode depressed, mild or moderate severity, unspecified: Secondary | ICD-10-CM

## 2013-05-11 MED ORDER — PROPRANOLOL HCL 10 MG PO TABS: 10.0000 mg | ORAL_TABLET | Freq: Three times a day (TID) | ORAL | Status: DC

## 2013-05-11 MED ORDER — CITALOPRAM HYDROBROMIDE 20 MG PO TABS: 20.0000 mg | ORAL_TABLET | Freq: Every morning | ORAL | Status: DC

## 2013-05-11 MED ORDER — CARBAMAZEPINE ER 400 MG PO TB12: 400.0000 mg | ORAL_TABLET | Freq: Two times a day (BID) | ORAL | Status: DC

## 2013-05-11 MED ORDER — CARBAMAZEPINE 200 MG PO TABS: 200.0000 mg | ORAL_TABLET | Freq: Three times a day (TID) | ORAL | Status: DC
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
## 2013-05-11 NOTE — Telephone Encounter (Signed)
Question about medication dosing

## 2013-05-15 ENCOUNTER — Telehealth (HOSPITAL_COMMUNITY): Payer: Self-pay | Admitting: Psychiatry

## 2013-05-15 NOTE — Telephone Encounter (Signed)
Faxed in response (ER tabs)

## 2013-05-15 NOTE — Telephone Encounter (Signed)
400 xr faxed in

## 2013-05-16 ENCOUNTER — Telehealth (HOSPITAL_COMMUNITY): Payer: Self-pay | Admitting: Psychiatry

## 2013-05-19 ENCOUNTER — Telehealth (HOSPITAL_COMMUNITY): Payer: Self-pay | Admitting: Psychiatry

## 2013-05-19 NOTE — Telephone Encounter (Signed)
Left message

## 2013-05-24 ENCOUNTER — Other Ambulatory Visit (HOSPITAL_COMMUNITY): Payer: Self-pay | Admitting: Psychiatry

## 2013-05-24 ENCOUNTER — Telehealth (HOSPITAL_COMMUNITY): Payer: Self-pay | Admitting: Psychiatry

## 2013-05-24 MED ORDER — CARBAMAZEPINE 200 MG PO TABS: 200.0000 mg | ORAL_TABLET | Freq: Four times a day (QID) | ORAL | Status: DC

## 2013-05-24 NOTE — Telephone Encounter (Signed)
Tegretol 200 mg qid sent to pharmacy

## 2013-05-29 ENCOUNTER — Encounter (HOSPITAL_COMMUNITY): Payer: Self-pay | Admitting: *Deleted

## 2013-05-29 ENCOUNTER — Inpatient Hospital Stay (HOSPITAL_COMMUNITY)
Admission: EM | Admit: 2013-05-29 | Discharge: 2013-06-02 | DRG: 787 | Disposition: A | Discharge status: Home / Self Care | Payer: BC Managed Care – PPO | Attending: General Surgery | Admitting: General Surgery

## 2013-05-29 ENCOUNTER — Emergency Department (HOSPITAL_COMMUNITY): Payer: BC Managed Care – PPO

## 2013-05-29 ENCOUNTER — Inpatient Hospital Stay (HOSPITAL_COMMUNITY): Payer: BC Managed Care – PPO

## 2013-05-29 DIAGNOSIS — R7401 Elevation of levels of liver transaminase levels: Secondary | ICD-10-CM

## 2013-05-29 DIAGNOSIS — F3130 Bipolar disorder, current episode depressed, mild or moderate severity, unspecified: Secondary | ICD-10-CM | POA: Diagnosis present

## 2013-05-29 DIAGNOSIS — R17 Unspecified jaundice: Secondary | ICD-10-CM

## 2013-05-29 DIAGNOSIS — F4310 Post-traumatic stress disorder, unspecified: Secondary | ICD-10-CM | POA: Diagnosis present

## 2013-05-29 DIAGNOSIS — R112 Nausea with vomiting, unspecified: Secondary | ICD-10-CM

## 2013-05-29 DIAGNOSIS — E039 Hypothyroidism, unspecified: Secondary | ICD-10-CM

## 2013-05-29 DIAGNOSIS — R7402 Elevation of levels of lactic acid dehydrogenase (LDH): Secondary | ICD-10-CM | POA: Diagnosis present

## 2013-05-29 DIAGNOSIS — F411 Generalized anxiety disorder: Secondary | ICD-10-CM | POA: Diagnosis present

## 2013-05-29 DIAGNOSIS — I959 Hypotension, unspecified: Secondary | ICD-10-CM | POA: Diagnosis present

## 2013-05-29 DIAGNOSIS — R1013 Epigastric pain: Secondary | ICD-10-CM

## 2013-05-29 DIAGNOSIS — K8020 Calculus of gallbladder without cholecystitis without obstruction: Secondary | ICD-10-CM

## 2013-05-29 DIAGNOSIS — Z87891 Personal history of nicotine dependence: Secondary | ICD-10-CM

## 2013-05-29 DIAGNOSIS — K8050 Calculus of bile duct without cholangitis or cholecystitis without obstruction: Principal | ICD-10-CM | POA: Diagnosis present

## 2013-05-29 DIAGNOSIS — K819 Cholecystitis, unspecified: Secondary | ICD-10-CM

## 2013-05-29 HISTORY — DX: Calculus of gallbladder without cholecystitis without obstruction: K80.20

## 2013-05-29 HISTORY — DX: Hypothyroidism, unspecified: E03.9

## 2013-05-29 HISTORY — DX: Suicide attempt, initial encounter: T14.91XA

## 2013-05-29 LAB — COMPREHENSIVE METABOLIC PANEL
ALT: 299 U/L — ABNORMAL HIGH (ref 0–35)
AST: 195 U/L — ABNORMAL HIGH (ref 0–37)
Albumin: 4 g/dL (ref 3.5–5.2)
Alkaline Phosphatase: 142 U/L — ABNORMAL HIGH (ref 39–117)
BUN: 6 mg/dL (ref 6–23)
CO2: 27 mEq/L (ref 19–32)
Calcium: 10.2 mg/dL (ref 8.4–10.5)
Chloride: 103 mEq/L (ref 96–112)
Creatinine, Ser: 0.62 mg/dL (ref 0.50–1.10)
GFR calc Af Amer: >90 mL/min (ref >90)
GFR calc non Af Amer: >90 mL/min (ref >90)
Glucose, Bld: 100 mg/dL — ABNORMAL HIGH (ref 70–99)
Potassium: 3.8 mEq/L (ref 3.5–5.1)
Sodium: 140 mEq/L (ref 135–145)
Total Bilirubin: 6.3 mg/dL — ABNORMAL HIGH (ref 0.3–1.2)
Total Protein: 7.2 g/dL (ref 6.0–8.3)

## 2013-05-29 LAB — CBC WITH DIFFERENTIAL/PLATELET
Basophils Relative: 0 % (ref 0–1)
Eosinophils Absolute: 0.1 10*3/uL (ref 0.0–0.7)
Eosinophils Relative: 2 % (ref 0–5)
MCH: 32.8 pg (ref 26.0–34.0)
MCHC: 34.4 g/dL (ref 30.0–36.0)
MCV: 95.2 fL (ref 78.0–100.0)
Neutrophils Relative %: 72 % (ref 43–77)
Platelets: 209 10*3/uL (ref 150–400)
RDW: 13 % (ref 11.5–15.5)

## 2013-05-29 LAB — URINE MICROSCOPIC-ADD ON

## 2013-05-29 LAB — URINALYSIS, ROUTINE W REFLEX MICROSCOPIC
Ketones, ur: NEGATIVE mg/dL
Protein, ur: NEGATIVE mg/dL
Urobilinogen, UA: 2 mg/dL — ABNORMAL HIGH (ref 0.0–1.0)
pH: 7 (ref 5.0–8.0)

## 2013-05-29 LAB — POCT PREGNANCY, URINE: Preg Test, Ur: NEGATIVE

## 2013-05-29 MED ORDER — ONDANSETRON HCL 4 MG/2ML IJ SOLN
4.0000 mg | Freq: Four times a day (QID) | INTRAMUSCULAR | Status: DC | PRN
  Administered 2013-05-29 – 2013-05-31 (×5): 4 mg via INTRAVENOUS
  Filled 2013-05-29 (×5): qty 2

## 2013-05-29 MED ORDER — PIPERACILLIN-TAZOBACTAM 3.375 G IVPB
3.3750 g | Freq: Once | INTRAVENOUS | Status: AC
  Administered 2013-05-29: 3.375 g via INTRAVENOUS
  Filled 2013-05-29: qty 50

## 2013-05-29 MED ORDER — KCL IN DEXTROSE-NACL 20-5-0.45 MEQ/L-%-% IV SOLN
INTRAVENOUS | Status: DC
  Administered 2013-05-29 – 2013-05-31 (×3): via INTRAVENOUS

## 2013-05-29 MED ORDER — PIPERACILLIN-TAZOBACTAM 3.375 G IVPB
3.3750 g | Freq: Three times a day (TID) | INTRAVENOUS | Status: DC
  Administered 2013-05-30 – 2013-06-02 (×9): 3.375 g via INTRAVENOUS
  Filled 2013-05-29 (×11): qty 50

## 2013-05-29 MED ORDER — HYDROMORPHONE HCL PF 1 MG/ML IJ SOLN
0.5000 mg | INTRAMUSCULAR | Status: DC | PRN
  Administered 2013-05-29: 1 mg via INTRAVENOUS
  Filled 2013-05-29: qty 1

## 2013-05-29 MED ORDER — MORPHINE SULFATE 4 MG/ML IJ SOLN
4.0000 mg | Freq: Once | INTRAMUSCULAR | Status: AC
  Administered 2013-05-29: 4 mg via INTRAVENOUS
  Filled 2013-05-29: qty 1

## 2013-05-29 MED ORDER — PROMETHAZINE HCL 25 MG/ML IJ SOLN
12.5000 mg | INTRAMUSCULAR | Status: DC | PRN
  Administered 2013-05-29: 12.5 mg via INTRAVENOUS
  Filled 2013-05-29: qty 1

## 2013-05-29 MED ORDER — BISACODYL 10 MG RE SUPP: 10.0000 mg | Freq: Every day | RECTAL | Status: DC | PRN

## 2013-05-29 MED ORDER — ONDANSETRON HCL 4 MG/2ML IJ SOLN
4.0000 mg | Freq: Once | INTRAMUSCULAR | Status: AC
  Administered 2013-05-29: 4 mg via INTRAVENOUS
  Filled 2013-05-29: qty 2

## 2013-05-29 MED ORDER — SODIUM CHLORIDE 0.9 % IV BOLUS (SEPSIS)
1000.0000 mL | Freq: Once | INTRAVENOUS | Status: AC
  Administered 2013-05-29: 1000 mL via INTRAVENOUS

## 2013-05-29 MED ORDER — ONDANSETRON HCL 4 MG PO TABS: 4.0000 mg | ORAL_TABLET | Freq: Four times a day (QID) | ORAL | Status: DC | PRN

## 2013-05-29 NOTE — Progress Notes (Signed)
ANTIBIOTIC CONSULT NOTE - INITIAL  Pharmacy Consult for Zosyn Indication: Cholecystitis  Allergies  Allergen Reactions  . Ativan [Lorazepam] Other (See Comments)    Caused her to do things and not remember doing them for >24 hours    Patient Measurements: Height: 5\' 9"  (175.3 cm) Weight: 220 lb (99.791 kg) IBW/kg (Calculated) : 66.2   Vital Signs: Temp: 98.4 F (36.9 C) (09/29 1852) Temp src: Oral (09/29 1852) BP: 101/67 mmHg (09/29 1852) Pulse Rate: 48 (09/29 1852) Intake/Output from previous day:   Intake/Output from this shift:    Labs:  Recent Labs  05/29/13 1455  WBC 7.6  HGB 15.6*  PLT 209  CREATININE 0.62   Estimated Creatinine Clearance: 128 ml/min (by C-G formula based on Cr of 0.62). No results found for this basename: VANCOTROUGH, VANCOPEAK, VANCORANDOM, GENTTROUGH, GENTPEAK, GENTRANDOM, TOBRATROUGH, TOBRAPEAK, TOBRARND, AMIKACINPEAK, AMIKACINTROU, AMIKACIN,  in the last 72 hours   Microbiology: No results found for this or any previous visit (from the past 720 hour(s)).  Medical History: Past Medical History  Diagnosis Date  . Hypothyroidism   . Anxiety   . Depression   . Bipolar disorder   . PTSD (post-traumatic stress disorder)   . Suicide attempt     once  . Gallstone     Medications:  Scheduled:  . [START ON 05/30/2013] piperacillin-tazobactam (ZOSYN)  IV  3.375 g Intravenous Q8H   Assessment: Zosyn 3.375 GM IV given in ED CrCl > 20 ml/min  Goal of Therapy:  Eradicate infection   Plan:  Zosyn 3.375 GM IV every 8 hours, infused over 4 hours Monitor renal function Labs per protocol  Raquel James, Fransisca Shawn Bennett 05/29/2013,7:37 PM

## 2013-05-29 NOTE — ED Notes (Signed)
Report given to Sarah, RN unit 300. Ready to receive patient. 

## 2013-05-29 NOTE — ED Provider Notes (Signed)
CSN: [id number]     Arrival date & time 05/29/13  1346 History   First MD Initiated Contact with Patient 05/29/13 1536     Chief Complaint  Patient presents with  . Abdominal Pain    Patient is a 32 y.o. female presenting with abdominal pain. The history is provided by the patient.  Abdominal Pain Pain location:  Epigastric Pain quality: sharp   Pain severity:  Moderate Onset quality:  Gradual Duration:  3 days Timing:  Intermittent Progression:  Worsening Chronicity:  New Context: not alcohol use   Relieved by:  Nothing Worsened by:  Palpation Associated symptoms: fatigue and vomiting   Associated symptoms: no dysuria and no vaginal bleeding     Past Medical History  Diagnosis Date  . Thyroid disease   . Mental disorder   . Anxiety   . Depression   . Bipolar disorder   . PTSD (post-traumatic stress disorder)    Past Surgical History  Procedure Laterality Date  . Breast reduction surgery    . Knee surgery    . Hip surgery     Family History  Problem Relation Age of Onset  . Anesthesia problems Neg Hx   . Hypotension Neg Hx   . Malignant hyperthermia Neg Hx   . Pseudochol deficiency Neg Hx   . Dementia Neg Hx   . Depression Neg Hx   . Schizophrenia Neg Hx   . Seizures Neg Hx   . Sexual abuse Mother     By her father  . Alcohol abuse Mother   . Anxiety disorder Mother   . Bipolar disorder Mother   . OCD Mother   . Physical abuse Mother   . ADD / ADHD Sister   . Drug abuse Sister   . Bipolar disorder Sister   . Anxiety disorder Sister   . Paranoid behavior Sister   . ADD / ADHD Sister   . OCD Maternal Aunt   . Cancer Maternal Grandfather   . Mental illness Maternal Grandfather   . ADD / ADHD Other   . Brain cancer Other    History  Substance Use Topics  . Smoking status: Former Smoker -- 2.00 packs/day for 15 years    Types: Cigarettes  . Smokeless tobacco: Never Used  . Alcohol Use: Yes     Comment: Used drink frequently 1 year ago. now  once/month   OB History   Grav Para Term Preterm Abortions TAB SAB Ect Mult Living                 Review of Systems  Constitutional: Positive for fatigue.  Gastrointestinal: Positive for vomiting and abdominal pain.  Genitourinary: Negative for dysuria and vaginal bleeding.  Neurological: Positive for weakness.  All other systems reviewed and are negative.    Allergies  Ativan  Home Medications   Current Outpatient Rx  Name  Route  Sig  Dispense  Refill  . carbamazepine (TEGRETOL) 200 MG tablet   Oral   Take 1 tablet (200 mg total) by mouth 4 (four) times daily.   120 tablet   2   . citalopram (CELEXA) 20 MG tablet   Oral   Take 1 tablet (20 mg total) by mouth every morning.   30 tablet   2   . propranolol (INDERAL) 10 MG tablet   Oral   Take 1 tablet (10 mg total) by mouth 3 (three) times daily.   90 tablet   1    BP 125/96  Pulse 80  Temp(Src) 98.9 F (37.2 C) (Oral)  Resp 17  Ht 5\' 9"  (1.753 m)  Wt 220 lb (99.791 kg)  BMI 32.47 kg/m2  SpO2 96%  LMP 04/01/2013 Physical Exam CONSTITUTIONAL: Well developed/well nourished HEAD: Normocephalic/atraumatic EYES: EOMI/PERRL, scleral icterus noted ENMT: Mucous membranes moist NECK: supple no meningeal signs SPINE:entire spine nontender CV: S1/S2 noted, no murmurs/rubs/gallops noted LUNGS: Lungs are clear to auscultation bilaterally, no apparent distress ABDOMEN: soft, mild epigastric tenderness noted, no rebound or guarding GU:no cva tenderness NEURO: Pt is awake/alert, moves all extremitiesx4 EXTREMITIES: pulses normal, full ROM SKIN: warm, jaundice noted PSYCH: no abnormalities of mood noted  ED Course  Procedures) Labs Review Labs Reviewed  CBC WITH DIFFERENTIAL - Abnormal; Notable for the following:    Hemoglobin 15.6 (*)    All other components within normal limits  COMPREHENSIVE METABOLIC PANEL - Abnormal; Notable for the following:    Glucose, Bld 100 (*)    AST 195 (*)    ALT 299 (*)     Alkaline Phosphatase 142 (*)    Total Bilirubin 6.3 (*)    All other components within normal limits  URINALYSIS, ROUTINE W REFLEX MICROSCOPIC - Abnormal; Notable for the following:    Hgb urine dipstick TRACE (*)    Bilirubin Urine MODERATE (*)    Urobilinogen, UA 2.0 (*)    All other components within normal limits  URINE MICROSCOPIC-ADD ON - Abnormal; Notable for the following:    Squamous Epithelial / LPF MANY (*)    All other components within normal limits  LIPASE, BLOOD  POCT PREGNANCY, URINE   Imaging Review No results found.  5:48 PM I spoke to surgery dr Loraine Leriche Lovell Sheehan, he requests medicine and GI consultation D/w triad dr short, will admit Call placed to GI as well Pt stable in the ED  MDM  No diagnosis found. Nursing notes including past medical history and social history reviewed and considered in documentation Labs/vital reviewed and considered     Joya Gaskins, MD 05/29/13 1749

## 2013-05-29 NOTE — H&P (Signed)
[facility] History and Physical  [name] [name] ZOX:[id number] DOB: Sep 09, 1980 DOA: 05/29/2013  Referring physician:  Zadie Rhine PCP:  No PCP Per Patient   Chief Complaint:  Abdominal pain   HPI:  The patient is a 32 y.o. year-old female with history of PTSD, bipolar disorder, hypothyroidism who presents with abdominal pain and jaundice.  The patient was last at their baseline health about 4 days ago.  Approximately 3 days ago she developed 10 out of 10 epigastric pain with radiation to the right upper quadrant into the back. The pain was worse when she sat up and got better when she lay down. She took half of that one of her mothers pain pills which reduced the pain but did not fully remove it.  The pain has been getting progressively worse over the last couple of days and she has noticed that her skin and eyes are started to turn yellow. Her urine has gotten darker. She thinks that she may have had some low-grade fevers but did not take her temperature. She has had nausea with vomiting of nonbilious material. She said that there was some possible reddish-colored material but he thinks it may have been something that she ate. Denied coffee grounds. Her last bowel movement was before this episode started. She has started propranolol and Tegretol. She has been on Tegretol the past, but propranolol is new medication for her. She denies any recent alcohol binges, her last being approximately one month ago. She has not taken any ibuprofen or Tylenol. She denies any recent drug use.  Also she has had a mild cough.  In the emergency department, her labs were notable for alkaline phosphatase of 142, total bilirubin of 6.3, AST 195, ALT 299, lipase 20. Her white blood cell count 7.6, hemoglobin 15.6, platelets 209. Her vital signs were stable and she was afebrile. Her abdominal ultrasound demonstrated sludge and a 17 mm gallstone within gallbladder. There is mild gallbladder wall thickening. Negative  Murphy's sign but the possibility of cholecystitis could not be excluded because there is some mild wall thickening at 4 mm. The common bile duct with normal and under 5 mm and there was no abnormality or stone seen in the pancreas.  Gastroenterology was called by the emergency Department staff and recommended a CT scan of the abdomen and pelvis to evaluate for possible obstructing stone.  General surgery was consulted, however, they declined admission and asked that GI be consultative and the patient be admitted to internal medicine as this seemed to be primarily an obstructive stone problem and cholecystitis less likely.   Review of Systems:  General:  Subjective fevers, chills, denies weight loss or gain HEENT:  Denies changes to hearing and vision, rhinorrhea, sinus congestion, sore throat CV:  Denies chest pain and palpitations, lower extremity edema.  PULM:  Denies SOB, wheezing, cough.   GI:  Denies nausea, vomiting, constipation, diarrhea.   GU:  Denies dysuria, frequency, urgency ENDO:  Denies polyuria, polydipsia.   HEME:  Denies hematemesis, blood in stools, melena, abnormal bruising or bleeding.  LYMPH:  Denies lymphadenopathy.   MSK:  Denies arthralgias, myalgias.   DERM:  Denies skin rash or ulcer.   NEURO:  Denies focal numbness, weakness, slurred speech, confusion, facial droop.  PSYCH:  Denies anxiety and depression.    Past Medical History  Diagnosis Date  . Hypothyroidism   . Anxiety   . Depression   . Bipolar disorder   . PTSD (post-traumatic stress disorder)   .  Suicide attempt     once  . Gallstone    Past Surgical History  Procedure Laterality Date  . Breast reduction surgery  2001  . Knee surgery Left     multiple.  meniscus repair, cyst removal  . Hip surgery Right 1999    slipped epiphysis   Social History:  reports that she quit smoking about 12 months ago. Her smoking use included Cigarettes. She has a 30 pack-year smoking history. She has never used  smokeless tobacco. She reports that  drinks alcohol. She reports that she does not use illicit drugs. Lives with her mom.  Works full time.    Allergies  Allergen Reactions  . Ativan [Lorazepam] Other (See Comments)    Caused her to do things and not remember doing them for >24 hours    Family History  Problem Relation Age of Onset  . Anesthesia problems Neg Hx   . Hypotension Neg Hx   . Malignant hyperthermia Neg Hx   . Pseudochol deficiency Neg Hx   . Dementia Neg Hx   . Depression Neg Hx   . Schizophrenia Neg Hx   . Seizures Neg Hx   . Sexual abuse Mother     By her father  . Alcohol abuse Mother   . Anxiety disorder Mother   . Bipolar disorder Mother   . OCD Mother   . Physical abuse Mother   . ADD / ADHD Sister   . Drug abuse Sister   . Bipolar disorder Sister   . Anxiety disorder Sister   . Paranoid behavior Sister   . ADD / ADHD Sister   . OCD Maternal Aunt   . Cancer Maternal Grandfather   . Mental illness Maternal Grandfather   . ADD / ADHD Other   . Brain cancer Other      Prior to Admission medications   Medication Sig Start Date End Date Taking? Authorizing Provider  carbamazepine (TEGRETOL) 200 MG tablet Take 1 tablet (200 mg total) by mouth 4 (four) times daily. 05/24/13 05/24/14 Yes Diannia Ruder, MD  citalopram (CELEXA) 20 MG tablet Take 1 tablet (20 mg total) by mouth every morning. 05/11/13  Yes Diannia Ruder, MD  ibuprofen (ADVIL,MOTRIN) 200 MG tablet Take 400 mg by mouth every 6 (six) hours as needed for pain.   Yes Historical Provider, MD  levothyroxine (SYNTHROID, LEVOTHROID) 25 MCG tablet Take 37.5 mcg by mouth daily before breakfast.   Yes Historical Provider, MD  propranolol (INDERAL) 10 MG tablet Take 1 tablet (10 mg total) by mouth 3 (three) times daily. 05/11/13  Yes Diannia Ruder, MD   Physical Exam: Filed Vitals:   05/29/13 1402 05/29/13 1836  BP: 125/96 111/68  Pulse: 80 52  Temp: 98.9 F (37.2 C) 98.8 F (37.1 C)  TempSrc: Oral Oral   Resp: 17 17  Height: 5\' 9"  (1.753 m)   Weight: 99.791 kg (220 lb)   SpO2: 96% 97%     General:  Adult female, in no acute distress, obvious jaundice and scleral icterus  Eyes:  PERRL, non-injected.  ENT:  Nares clear.  OP clear, non-erythematous without plaques or exudates.  Mild petechiae posterior pharynx MMM.  Neck:  Supple without TM or JVD.    Lymph:  No cervical, supraclavicular, or submandibular LAD.  Cardiovascular:  RRR, normal S1, S2, without m/r/g.  2+ pulses, warm extremities  Respiratory:  CTA bilaterally without increased WOB.  Abdomen:  NABS.  Soft, ND, Mild TTP in the epigastrium and right  upper quadrant without rebound or guarding.    Skin:  No rashes or focal lesions.  Musculoskeletal:  Normal bulk and tone.  No LE edema.  Psychiatric:  A & O x 4.  Appropriate affect.  Neurologic:  CN 3-12 intact.  5/5 strength.  Sensation intact.  Labs on Admission:  Basic Metabolic Panel:  Recent Labs Lab 05/29/13 1455  NA 140  K 3.8  CL 103  CO2 27  GLUCOSE 100*  BUN 6  CREATININE 0.62  CALCIUM 10.2   Liver Function Tests:  Recent Labs Lab 05/29/13 1455  AST 195*  ALT 299*  ALKPHOS 142*  BILITOT 6.3*  PROT 7.2  ALBUMIN 4.0    Recent Labs Lab 05/29/13 1455  LIPASE 20   No results found for this basename: AMMONIA,  in the last 168 hours CBC:  Recent Labs Lab 05/29/13 1455  WBC 7.6  NEUTROABS 5.5  HGB 15.6*  HCT 45.3  MCV 95.2  PLT 209   Cardiac Enzymes: No results found for this basename: CKTOTAL, CKMB, CKMBINDEX, TROPONINI,  in the last 168 hours  BNP (last 3 results) No results found for this basename: PROBNP,  in the last 8760 hours CBG: No results found for this basename: GLUCAP,  in the last 168 hours  Radiological Exams on Admission: US Abdomen Complete  05/29/2013   *RADIOLOGY REPORT*  Clinical Data:  That the epigastric pain and right upper quadrant pain for 3 days  COMPLETE ABDOMINAL ULTRASOUND  Comparison:  None.   Findings:  Gallbladder:  There is gallbladder sludge, and there is a single calculus measuring 17 mm.  The wall is mildly prominent at 4 mm. There is no Murphy's sign.  Common bile duct:  Normal at under 5 mm.  Liver:  No focal lesion identified.  Within normal limits in parenchymal echogenicity.  IVC:  Appears normal.  Pancreas:  No focal abnormality seen.  Spleen:   normal, measuring 8.1 cm  Right Kidney:  Normal, measuring 10.5 cm  Left Kidney:  Normal, measuring 11.4 cm  Abdominal aorta:  No aneurysm identified.  IMPRESSION: Sludge and 17 mm gallstone with mild gallbladder wall thickening.Possibility of cholecystitis not excluded.   Original Report Authenticated By: Esperanza Heir, M.D.    Assessment/Plan Principal Problem:   Cholestatic jaundice Active Problems:   Bipolar affective disorder, depressed   PTSD (post-traumatic stress disorder)   Hypothyroidism   Transaminitis   Gallstones   Nausea and vomiting  ---  Ms. Stanley's history suggests either pancreatitis or a gallstone lodged in the pancreas suggested by her epigastric pain with radiation to the back.  Her lipase, however, is normal.  Her elevated bilirubin and transaminitis suggest obstruction also, however, there is no dilation of the CBD on [place].  Tegretol, which was recently restarted, can cause both pancreatitis and hepatotoxicity but she also describes symptoms of "gallbladder attacks" over the last year.  Despite being afebrile and normal WBC count, unable to fully rule out early cholecystitis with wall thickening and obstructive picture.    -  NPO -  CT abd/pelvis with contrast to eval for pancreatitis and stones -  zofran and dilaudid prn -  Empiric zosyn -  GI and surgery consults pending  Bipolar disorder, stable.  Hold oral medications for now  Hypothyroidism, synthroid has long half-life.  Hold oral medications for now  Diet:  NPO Access:  PIV IVF:  D5 1/2 NS with 20kcl Proph:  SCD  Code Status: full Family  Communication: patient alone  Disposition Plan: Admit to med-surg  Time spent: 60 min Endya Austin [facility] Pager [phone]  If 7PM-7AM, please contact night-coverage [email or web address] Password [facility] 05/29/2013, 6:42 PM

## 2013-05-29 NOTE — ED Notes (Signed)
abd pain,back pain , with radiation into chest, jaundiced.  Vomiting,no diarrhea.  Urine is dark

## 2013-05-29 NOTE — ED Notes (Signed)
Admitting MD at bedside.

## 2013-05-30 ENCOUNTER — Encounter (HOSPITAL_COMMUNITY): Payer: Self-pay | Admitting: Gastroenterology

## 2013-05-30 ENCOUNTER — Inpatient Hospital Stay (HOSPITAL_COMMUNITY): Payer: BC Managed Care – PPO

## 2013-05-30 DIAGNOSIS — K819 Cholecystitis, unspecified: Secondary | ICD-10-CM

## 2013-05-30 DIAGNOSIS — R7989 Other specified abnormal findings of blood chemistry: Secondary | ICD-10-CM

## 2013-05-30 LAB — CBC
Hemoglobin: 14.1 g/dL (ref 12.0–15.0)
MCH: 32.3 pg (ref 26.0–34.0)
MCV: 97 fL (ref 78.0–100.0)
Platelets: 204 10*3/uL (ref 150–400)
RBC: 4.37 MIL/uL (ref 3.87–5.11)
WBC: 8.5 10*3/uL (ref 4.0–10.5)

## 2013-05-30 LAB — COMPREHENSIVE METABOLIC PANEL
ALT: 212 U/L — ABNORMAL HIGH (ref 0–35)
AST: 90 U/L — ABNORMAL HIGH (ref 0–37)
Albumin: 3.3 g/dL — ABNORMAL LOW (ref 3.5–5.2)
CO2: 28 mEq/L (ref 19–32)
Chloride: 105 mEq/L (ref 96–112)
Creatinine, Ser: 0.56 mg/dL (ref 0.50–1.10)
Sodium: 141 mEq/L (ref 135–145)
Total Bilirubin: 3 mg/dL — ABNORMAL HIGH (ref 0.3–1.2)

## 2013-05-30 LAB — SURGICAL PCR SCREEN
MRSA, PCR: NEGATIVE
Staphylococcus aureus: NEGATIVE

## 2013-05-30 MED ORDER — IOHEXOL 300 MG/ML  SOLN
50.0000 mL | Freq: Once | INTRAMUSCULAR | Status: AC | PRN
  Administered 2013-05-30: 50 mL via ORAL

## 2013-05-30 MED ORDER — CHLORHEXIDINE GLUCONATE 4 % EX LIQD
1.0000 "application " | Freq: Once | CUTANEOUS | Status: AC
  Administered 2013-05-30: 1 via TOPICAL
  Filled 2013-05-30: qty 15

## 2013-05-30 MED ORDER — PANTOPRAZOLE SODIUM 40 MG PO TBEC
40.0000 mg | DELAYED_RELEASE_TABLET | Freq: Every day | ORAL | Status: DC
  Administered 2013-05-30 – 2013-06-02 (×4): 40 mg via ORAL
  Filled 2013-05-30 (×4): qty 1

## 2013-05-30 MED ORDER — PIPERACILLIN-TAZOBACTAM 3.375 G IVPB
INTRAVENOUS | Status: AC
  Filled 2013-05-30: qty 50

## 2013-05-30 MED ORDER — MORPHINE SULFATE 2 MG/ML IJ SOLN
1.0000 mg | INTRAMUSCULAR | Status: DC | PRN
  Administered 2013-05-30 (×3): 2 mg via INTRAVENOUS
  Filled 2013-05-30 (×3): qty 1

## 2013-05-30 MED ORDER — NICOTINE 14 MG/24HR TD PT24
14.0000 mg | MEDICATED_PATCH | Freq: Every day | TRANSDERMAL | Status: DC
  Administered 2013-05-30: 14 mg via TRANSDERMAL
  Filled 2013-05-30: qty 1

## 2013-05-30 MED ORDER — GADOBENATE DIMEGLUMINE 529 MG/ML IV SOLN
20.0000 mL | Freq: Once | INTRAVENOUS | Status: AC | PRN
  Administered 2013-05-30: 20 mL via INTRAVENOUS

## 2013-05-30 MED ORDER — IOHEXOL 300 MG/ML  SOLN
100.0000 mL | Freq: Once | INTRAMUSCULAR | Status: AC | PRN
  Administered 2013-05-30: 100 mL via INTRAVENOUS

## 2013-05-30 MED ORDER — ENOXAPARIN SODIUM 40 MG/0.4ML ~~LOC~~ SOLN
40.0000 mg | Freq: Once | SUBCUTANEOUS | Status: AC
  Administered 2013-05-31: 40 mg via SUBCUTANEOUS
  Filled 2013-05-30: qty 0.4

## 2013-05-30 NOTE — Progress Notes (Signed)
[facility] PROGRESS NOTE  Misty Foutz ZOX:[id number] DOB: 07-Oct-1980 DOA: 05/29/2013 PCP: No PCP Per Patient  Assessment/Plan  Probably symptomatic gallstone with obstruction.  Likely that gallstone has passed as CBD appears normal on [place] and CT abd.  Also, she feels better and her LFTs have trended down.  -  Cholecystectomy with intraoperative cholangiogram -  If cholangiogram positive, then would proceed with ERCP -  Appreciate GI and Gen Surgery recommendations -  Continue NPO with IVF  Acute cholecystitis -  Continue zosyn -  Gen Surg following  Bipolar disorder, currently stable and holding oral medications -  Restart as soon as possible.   Hypothyroidism, holding synthroid -   Restart ASAP  Diet:  NPO Access:  PIV IVF:  yes Proph:  SCD  Code Status: full  Family Communication: patient and her BF Disposition Plan:  Pending cholecystectomy   Consultants:  Gen surgery, Dr. Lovell Sheehan  GI  Procedures:  Abd [place]  CT abd/pelvis  Antibiotics:  Zosyn 9/29 >>   HPI/Subjective:  The patient states that she had some emesis overnight there was clear, however her nausea and abdominal pain have both been improving.  She is feeling much better..   Subjective: Filed Vitals:   05/29/13 1836 05/29/13 1852 05/29/13 2158 05/30/13 0522  BP: 111/68 101/67 111/76 111/72  Pulse: 52 48 59 43  Temp: 98.8 F (37.1 C) 98.4 F (36.9 C) 98.2 F (36.8 C) 97.7 F (36.5 C)  TempSrc: Oral Oral Oral Oral  Resp: 17  18 16   Height:  5\' 9"  (1.753 m)    Weight:  99.791 kg (220 lb)    SpO2: 97% 98% 98% 100%    Intake/Output Summary (Last 24 hours) at 05/30/13 1054 Last data filed at 05/30/13 1034  Gross per 24 hour  Intake    910 ml  Output      2 ml  Net    908 ml   Filed Weights   05/29/13 1402 05/29/13 1852  Weight: 99.791 kg (220 lb) 99.791 kg (220 lb)    Exam:   General:   adult female, No acute distress less jaundiced than yesterday   HEENT:  NCAT, MMM,  persistent scleral icterus  Cardiovascular:  RRR, nl S1, S2 no mrg, 2+ pulses, warm extremities  Respiratory:  CTAB, no increased WOB  Abdomen:   NABS, soft,  mild tenderness to palpation in the right upper cord and in epigastric area, no rebound or guarding, nondistended   MSK:   Normal tone and bulk, no LEE  Neuro:  Grossly intact  Data Reviewed: Basic Metabolic Panel:  Recent Labs Lab 05/29/13 1455 05/30/13 0500  NA 140 141  K 3.8 3.7  CL 103 105  CO2 27 28  GLUCOSE 100* 85  BUN 6 5*  CREATININE 0.62 0.56  CALCIUM 10.2 9.4   Liver Function Tests:  Recent Labs Lab 05/29/13 1455 05/30/13 0500  AST 195* 90*  ALT 299* 212*  ALKPHOS 142* 119*  BILITOT 6.3* 3.0*  PROT 7.2 6.0  ALBUMIN 4.0 3.3*    Recent Labs Lab 05/29/13 1455 05/30/13 0500  LIPASE 20 23   No results found for this basename: AMMONIA,  in the last 168 hours CBC:  Recent Labs Lab 05/29/13 1455 05/30/13 0500  WBC 7.6 8.5  NEUTROABS 5.5  --   HGB 15.6* 14.1  HCT 45.3 42.4  MCV 95.2 97.0  PLT 209 204   Cardiac Enzymes: No results found for this basename: CKTOTAL, CKMB, CKMBINDEX,  TROPONINI,  in the last 168 hours BNP (last 3 results) No results found for this basename: PROBNP,  in the last 8760 hours CBG: No results found for this basename: GLUCAP,  in the last 168 hours  No results found for this or any previous visit (from the past 240 hour(s)).   Studies: US Abdomen Complete  05/29/2013   *RADIOLOGY REPORT*  Clinical Data:  That the epigastric pain and right upper quadrant pain for 3 days  COMPLETE ABDOMINAL ULTRASOUND  Comparison:  None.  Findings:  Gallbladder:  There is gallbladder sludge, and there is a single calculus measuring 17 mm.  The wall is mildly prominent at 4 mm. There is no Murphy's sign.  Common bile duct:  Normal at under 5 mm.  Liver:  No focal lesion identified.  Within normal limits in parenchymal echogenicity.  IVC:  Appears normal.  Pancreas:  No focal  abnormality seen.  Spleen:   normal, measuring 8.1 cm  Right Kidney:  Normal, measuring 10.5 cm  Left Kidney:  Normal, measuring 11.4 cm  Abdominal aorta:  No aneurysm identified.  IMPRESSION: Sludge and 17 mm gallstone with mild gallbladder wall thickening.Possibility of cholecystitis not excluded.   Original Report Authenticated By: Esperanza Heir, M.D.   Ct Abdomen Pelvis W Contrast  05/29/2013   CLINICAL DATA:  Abdominal pain and elevated liver function studies.  EXAM: CT ABDOMEN AND PELVIS WITH CONTRAST  TECHNIQUE: Multidetector CT imaging of the abdomen and pelvis was performed using the standard protocol following bolus administration of intravenous contrast.  CONTRAST:  100 cc Omnipaque 300  COMPARISON:  Abdominal ultrasound 05/29/2013.  FINDINGS: The lung bases are clear. Minimal lingular there is a small hiatal hernia. Atelectasis.  There is diffuse fatty infiltration of the liver. No focal hepatic lesions. The gallbladder is distended with moderate wall thickening, gallstones and pericholecystic fluid consistent with the ultrasound findings of acute cholecystitis. The common bile duct measures 6 mm. The pancreas is normal. The spleen is normal. The adrenal glands and kidneys are normal.  The stomach, duodenum, small bowel and colon are unremarkable. No inflammatory changes, mass lesions or obstructive findings. The appendix is normal. No mesenteric or retroperitoneal mass or adenopathy. The aorta is normal.  The uterus and ovaries are normal. The bladder is normal. No pelvic mass, adenopathy or free pelvic fluid collections. No inguinal mass or adenopathy.  The bony structures are unremarkable.  IMPRESSION: CT findings confirm the ultrasound findings of acute cholecystitis. No obvious common bile duct stone. The common bile duct is upper limits of normal at 6 mm.   Electronically Signed   By: Loralie Champagne M.D.   On: 05/29/2013 23:46    Scheduled Meds: . pantoprazole  40 mg Oral Daily  .  piperacillin-tazobactam (ZOSYN)  IV  3.375 g Intravenous Q8H   Continuous Infusions: . dextrose 5 % and 0.45 % NaCl with KCl 20 mEq/L 100 mL/hr at 05/29/13 2124    Principal Problem:   Cholestatic jaundice Active Problems:   Bipolar affective disorder, depressed   PTSD (post-traumatic stress disorder)   Hypothyroidism   Transaminitis   Gallstones   Nausea and vomiting   Abdominal pain, epigastric    Time spent: 30 min    Jaley Yan, [facility]  [facility] Pager [phone]. If 7PM-7AM, please contact night-coverage at [email or web address], password [facility] 05/30/2013, 10:54 AM  LOS: 1 day

## 2013-05-30 NOTE — Progress Notes (Signed)
UR Chart Review Completed  

## 2013-05-30 NOTE — Progress Notes (Signed)
MRCP negative for choledocholithiasis.  Will proceed with laparoscopic cholecystectomy with cholangiograms tomorrow.  Risks and benefits of procedure including bleeding, infection, hepatobiliary injury, and the possibility of an open procedure were fully explained to the patient, who gives informed consent.

## 2013-05-30 NOTE — Consult Note (Signed)
Referring Provider: Dr. Malachi Bonds (Hospitalist) Primary Care Physician:  No PCP Per Patient Primary Gastroenterologist:  Dr. Darrick Penna   Date of Admission: 05/29/13 Date of Consultation: 05/30/13  Reason for Consultation:  Elevated LFTs  HPI:  [name] [name] is a 32 year old female who presented to the ED yesterday evening with abdominal pain and jaundice. Admitting labs revealed a mixed pattern with Tbili 6.3, AST 195, ALT 299, and AP 142. Conjugated bilirubin not performed. Lipase normal. [place] of abdomen revealed sludge and 17 mm gallstone with mild gallbladder wall thickening; CT abd/pelvis then performed confirming acute cholecystitis, no obvious CBD stone, CBD upper limits of normal at 6mm. Repeat LFTs this morning show total bilirubin trending down, now at 3, with improvement in transaminases.   Sudden onset of pain 3 days ago in epigastric area/RUQ pain. Radiating to her back. Eating worsened symptoms. Associated nausea and vomiting. Unsure if she had fever or chills. Denied any obvious yellowing of skin but did notice a  tea-colored urine. 2 year history of milder symptoms. No BM for 3-4 days since being sick. Otherwise, no changes in bowel habits, no rectal bleeding.   Past Medical History  Diagnosis Date  . Hypothyroidism   . Anxiety   . Depression   . Bipolar disorder   . PTSD (post-traumatic stress disorder)   . Suicide attempt     once  . Gallstone     Past Surgical History  Procedure Laterality Date  . Breast reduction surgery  2001  . Knee surgery Left     multiple.  meniscus repair, cyst removal  . Hip surgery Right 1999    slipped epiphysis    Prior to Admission medications   Medication Sig Start Date End Date Taking? Authorizing Provider  carbamazepine (TEGRETOL) 200 MG tablet Take 1 tablet (200 mg total) by mouth 4 (four) times daily. 05/24/13 05/24/14 Yes Diannia Ruder, MD  citalopram (CELEXA) 20 MG tablet Take 1 tablet (20 mg total) by mouth every morning. 05/11/13   Yes Diannia Ruder, MD  levothyroxine (SYNTHROID, LEVOTHROID) 25 MCG tablet Take 37.5 mcg by mouth daily before breakfast.   Yes Historical Provider, MD  propranolol (INDERAL) 10 MG tablet Take 1 tablet (10 mg total) by mouth 3 (three) times daily. 05/11/13  Yes Diannia Ruder, MD    Current Facility-Administered Medications  Medication Dose Route Frequency Provider Last Rate Last Dose  . bisacodyl (DULCOLAX) suppository 10 mg  10 mg Rectal Daily PRN Renae Fickle, MD      . dextrose 5 % and 0.45 % NaCl with KCl 20 mEq/L infusion   Intravenous Continuous Renae Fickle, MD 100 mL/hr at 05/29/13 2124    . HYDROmorphone (DILAUDID) injection 0.5-1 mg  0.5-1 mg Intravenous Q3H PRN Renae Fickle, MD   1 mg at 05/29/13 2117  . ondansetron (ZOFRAN) tablet 4 mg  4 mg Oral Q6H PRN Renae Fickle, MD       Or  . ondansetron [facility]) injection 4 mg  4 mg Intravenous Q6H PRN Renae Fickle, MD   4 mg at 05/29/13 2123  . piperacillin-tazobactam (ZOSYN) IVPB 3.375 g  3.375 g Intravenous Q8H Renae Fickle, MD   3.375 g at 05/30/13 0256  . promethazine (PHENERGAN) injection 12.5 mg  12.5 mg Intravenous Q4H PRN Meredeth Ide, MD   12.5 mg at 05/29/13 2317    Allergies as of 05/29/2013 - Review Complete 05/29/2013  Allergen Reaction Noted  . Ativan [lorazepam] Other (See Comments) 02/08/2013    Family History  Problem Relation Age of Onset  . Anesthesia problems Neg Hx   . Hypotension Neg Hx   . Malignant hyperthermia Neg Hx   . Pseudochol deficiency Neg Hx   . Dementia Neg Hx   . Depression Neg Hx   . Schizophrenia Neg Hx   . Seizures Neg Hx   . Sexual abuse Mother     By her father  . Alcohol abuse Mother   . Anxiety disorder Mother   . Bipolar disorder Mother   . OCD Mother   . Physical abuse Mother   . ADD / ADHD Sister   . Drug abuse Sister   . Bipolar disorder Sister   . Anxiety disorder Sister   . Paranoid behavior Sister   . ADD / ADHD Sister   . OCD Maternal Aunt   . Cancer  Maternal Grandfather   . Mental illness Maternal Grandfather   . ADD / ADHD Other   . Brain cancer Other     History   Social History  . Marital Status: Single    Spouse Name: N/A    Number of Children: N/A  . Years of Education: N/A   Occupational History  . Not on file.   Social History Main Topics  . Smoking status: Former Smoker -- 2.00 packs/day for 15 years    Types: Cigarettes    Quit date: 05/01/2012  . Smokeless tobacco: Never Used  . Alcohol Use: Yes     Comment: Used drink frequently 1 year ago. now once/month  . Drug Use: No  . Sexual Activity: No   Other Topics Concern  . Not on file   Social History Narrative   Lives with her mom.  Works full time.      Review of Systems: Negative unless mentioned in HPI.   Physical Exam: Vital signs in last 24 hours: Temp:  [97.7 F (36.5 C)-98.9 F (37.2 C)] 97.7 F (36.5 C) (09/30 0522) Pulse Rate:  [43-80] 43 (09/30 0522) Resp:  [16-18] 16 (09/30 0522) BP: (101-125)/(67-96) 111/72 mmHg (09/30 0522) SpO2:  [96 %-100 %] 100 % (09/30 0522) Weight:  [220 lb (99.791 kg)] 220 lb (99.791 kg) (09/29 1852) Last BM Date: 05/25/13 General:   Alert,  Well-developed, well-nourished, pleasant and cooperative in NAD. Olive complexion Head:  Normocephalic and atraumatic. Eyes:  Mild icterus Ears:  Normal auditory acuity. Nose:  No deformity, discharge,  or lesions. Mouth:  No deformity or lesions, dentition normal. Neck:  Supple; no masses or thyromegaly. Lungs:  Clear throughout to auscultation.   No wheezes, crackles, or rhonchi. No acute distress. Heart:  Regular rate and rhythm; no murmurs, clicks, rubs,  or gallops. Abdomen:  Soft, significantly TTP RUQ and epigastric region, nondistended. No masses, hepatosplenomegaly or hernias noted. Normal bowel sounds, without guarding, and without rebound.   Rectal:  Deferred  Msk:  Symmetrical without gross deformities. Normal posture. Pulses:  Normal pulses  noted. Extremities:  Without clubbing or edema. Neurologic:  Alert and  oriented x4;  grossly normal neurologically. Skin:  Intact without significant lesions or rashes. Cervical Nodes:  No significant cervical adenopathy. Psych:  Alert and cooperative. Normal mood and affect.  Intake/Output from previous day: 09/29 0701 - 09/30 0700 In: 910 [I.V.:860; IV Piggyback:50] Out: 2 [Urine:2] Intake/Output this shift:    Lab Results:  Recent Labs  05/29/13 1455 05/30/13 0500  WBC 7.6 8.5  HGB 15.6* 14.1  HCT 45.3 42.4  PLT 209 204   BMET  Recent Labs  05/29/13 1455 05/30/13 0500  NA 140 141  K 3.8 3.7  CL 103 105  CO2 27 28  GLUCOSE 100* 85  BUN 6 5*  CREATININE 0.62 0.56  CALCIUM 10.2 9.4   LFT  Recent Labs  05/29/13 1455 05/30/13 0500  PROT 7.2 6.0  ALBUMIN 4.0 3.3*  AST 195* 90*  ALT 299* 212*  ALKPHOS 142* 119*  BILITOT 6.3* 3.0*    Studies/Results: US Abdomen Complete  05/29/2013   *RADIOLOGY REPORT*  Clinical Data:  That the epigastric pain and right upper quadrant pain for 3 days  COMPLETE ABDOMINAL ULTRASOUND  Comparison:  None.  Findings:  Gallbladder:  There is gallbladder sludge, and there is a single calculus measuring 17 mm.  The wall is mildly prominent at 4 mm. There is no Murphy's sign.  Common bile duct:  Normal at under 5 mm.  Liver:  No focal lesion identified.  Within normal limits in parenchymal echogenicity.  IVC:  Appears normal.  Pancreas:  No focal abnormality seen.  Spleen:   normal, measuring 8.1 cm  Right Kidney:  Normal, measuring 10.5 cm  Left Kidney:  Normal, measuring 11.4 cm  Abdominal aorta:  No aneurysm identified.  IMPRESSION: Sludge and 17 mm gallstone with mild gallbladder wall thickening.Possibility of cholecystitis not excluded.   Original Report Authenticated By: Esperanza Heir, M.D.   Ct Abdomen Pelvis W Contrast  05/29/2013   CLINICAL DATA:  Abdominal pain and elevated liver function studies.  EXAM: CT ABDOMEN AND PELVIS  WITH CONTRAST  TECHNIQUE: Multidetector CT imaging of the abdomen and pelvis was performed using the standard protocol following bolus administration of intravenous contrast.  CONTRAST:  100 cc Omnipaque 300  COMPARISON:  Abdominal ultrasound 05/29/2013.  FINDINGS: The lung bases are clear. Minimal lingular there is a small hiatal hernia. Atelectasis.  There is diffuse fatty infiltration of the liver. No focal hepatic lesions. The gallbladder is distended with moderate wall thickening, gallstones and pericholecystic fluid consistent with the ultrasound findings of acute cholecystitis. The common bile duct measures 6 mm. The pancreas is normal. The spleen is normal. The adrenal glands and kidneys are normal.  The stomach, duodenum, small bowel and colon are unremarkable. No inflammatory changes, mass lesions or obstructive findings. The appendix is normal. No mesenteric or retroperitoneal mass or adenopathy. The aorta is normal.  The uterus and ovaries are normal. The bladder is normal. No pelvic mass, adenopathy or free pelvic fluid collections. No inguinal mass or adenopathy.  The bony structures are unremarkable.  IMPRESSION: CT findings confirm the ultrasound findings of acute cholecystitis. No obvious common bile duct stone. The common bile duct is upper limits of normal at 6 mm.   Electronically Signed   By: Loralie Champagne M.D.   On: 05/29/2013 23:46    Impression: 32 year old female admitted with acute abdominal pain and elevated LFTs with further imaging revealing acute cholecystitis but no obvious CBD stone. CBD was upper limits of normal at 6mm, but LFTs have already improved since admission, trending downward. In this scenario, she could have possibly had passage of small stones, but General Surgery consultation is recommended for laparoscopic cholecystectomy with likely intraoperative cholangiogram. No need for ERCP at this moment due to improving LFTs and no obvious CBD stone. Anticipate  cholecystectomy in the near future.   Plan: Surgery consult pending Add PPI for GI prophylaxis HFP in am Agree with Zosyn Will follow with you  Nira Retort, ANP-BC [facility] Gastroenter[facility]     LOS:  1 day    05/30/2013, 8:22 AM

## 2013-05-30 NOTE — Care Management Note (Signed)
    Page 1 of 1   06/02/2013     9:20:32 AM   CARE MANAGEMENT NOTE 06/02/2013  Patient:  LANEISHA, MINO   Account Number:  [id number]  Date Initiated:  05/30/2013  Documentation initiated by:  Rosemary Holms  Subjective/Objective Assessment:   Pt admitted from where she lives with her mother. Pt not in room but CM spoke to mother and sister at bedside. Mom can not identify any HH/DME needs at this time.     Action/Plan:   Anticipated DC Date:  06/01/2013   Anticipated DC Plan:  HOME/SELF CARE      DC Planning Services  CM consult      Choice offered to / List presented to:             Status of service:  Completed, signed off Medicare Important Message given?   (If response is "NO", the following Medicare IM given date fields will be blank) Date Medicare IM given:   Date Additional Medicare IM given:    Discharge Disposition:  HOME/SELF CARE  Per UR Regulation:    If discussed at Long Length of Stay Meetings, dates discussed:    Comments:  05/30/13 Rosemary Holms RN BSN CM

## 2013-05-30 NOTE — Consult Note (Signed)
REVIEWED. AGREE. NL CBD VIA CT. MRCP: NO CBD STONE. NO INDICATION FOR ERCP AT THIS TIME. WILL AWAIT CHOLANGIOGRAM.

## 2013-05-30 NOTE — Consult Note (Signed)
Reason for Consult: Jaundice, cholelithiasis Referring Physician: [facility]  [name] [name] is an 32 y.o. female.  HPI: Patient is a 32 year old white female who presents with a four-day history of worsening indigestion upper abdominal pain. She also noted that her urine was turning orange. She presented the emergency room was found to have a total bilirubin of 6. Ultrasound of gallbladder revealed cholelithiasis. Common bile duct was upper limit of normal. CT scan of the abdomen and pelvis confirmed that the common bile duct was the upper limit of normal. She's been admitted to the hospital for further evaluation and treatment.  Past Medical History  Diagnosis Date  . Hypothyroidism   . Anxiety   . Depression   . Bipolar disorder   . PTSD (post-traumatic stress disorder)   . Suicide attempt     once  . Gallstone     Past Surgical History  Procedure Laterality Date  . Breast reduction surgery  2001  . Knee surgery Left     multiple.  meniscus repair, cyst removal  . Hip surgery Right 1999    slipped epiphysis    Family History  Problem Relation Age of Onset  . Anesthesia problems Neg Hx   . Hypotension Neg Hx   . Malignant hyperthermia Neg Hx   . Pseudochol deficiency Neg Hx   . Dementia Neg Hx   . Depression Neg Hx   . Schizophrenia Neg Hx   . Seizures Neg Hx   . Sexual abuse Mother     By her father  . Alcohol abuse Mother   . Anxiety disorder Mother   . Bipolar disorder Mother   . OCD Mother   . Physical abuse Mother   . ADD / ADHD Sister   . Drug abuse Sister   . Bipolar disorder Sister   . Anxiety disorder Sister   . Paranoid behavior Sister   . ADD / ADHD Sister   . OCD Maternal Aunt   . Cancer Maternal Grandfather   . Mental illness Maternal Grandfather   . ADD / ADHD Other   . Brain cancer Other   . Colon cancer Neg Hx     Social History:  reports that she quit smoking about 12 months ago. Her smoking use included Cigarettes. She has a 30  pack-year smoking history. She has never used smokeless tobacco. She reports that  drinks alcohol. She reports that she does not use illicit drugs.  Allergies:  Allergies  Allergen Reactions  . Ativan [Lorazepam] Other (See Comments)    Caused her to do things and not remember doing them for >24 hours    Medications: I have reviewed the patient's current medications.  Results for orders placed during the hospital encounter of 05/29/13 (from the past 48 hour(s))  URINALYSIS, ROUTINE W REFLEX MICROSCOPIC     Status: Abnormal   Collection Time    05/29/13  2:19 PM      Result Value Range   Color, Urine YELLOW  YELLOW   APPearance CLEAR  CLEAR   Specific Gravity, Urine 1.015  1.005 - 1.030   pH 7.0  5.0 - 8.0   Glucose, UA NEGATIVE  NEGATIVE mg/dL   Hgb urine dipstick TRACE (*) NEGATIVE   Bilirubin Urine MODERATE (*) NEGATIVE   Ketones, ur NEGATIVE  NEGATIVE mg/dL   Protein, ur NEGATIVE  NEGATIVE mg/dL   Urobilinogen, UA 2.0 (*) 0.0 - 1.0 mg/dL   Nitrite NEGATIVE  NEGATIVE   Leukocytes, UA NEGATIVE  NEGATIVE  URINE MICROSCOPIC-ADD ON     Status: Abnormal   Collection Time    05/29/13  2:19 PM      Result Value Range   Squamous Epithelial / LPF MANY (*) RARE   WBC, UA 0-2  <3 WBC/hpf   RBC / HPF 3-6  <3 RBC/hpf   Bacteria, UA RARE  RARE  POCT PREGNANCY, URINE     Status: None   Collection Time    05/29/13  2:29 PM      Result Value Range   Preg Test, Ur NEGATIVE  NEGATIVE   Comment:            THE SENSITIVITY OF THIS     METHODOLOGY IS >24 mIU/mL  CBC WITH DIFFERENTIAL     Status: Abnormal   Collection Time    05/29/13  2:55 PM      Result Value Range   WBC 7.6  4.0 - 10.5 K/uL   RBC 4.76  3.87 - 5.11 MIL/uL   Hemoglobin 15.6 (*) 12.0 - 15.0 g/dL   HCT 96.0  45.4 - 09.8 %   MCV 95.2  78.0 - 100.0 fL   MCH 32.8  26.0 - 34.0 pg   MCHC 34.4  30.0 - 36.0 g/dL   RDW 11.9  14.7 - 82.9 %   Platelets 209  150 - 400 K/uL   Neutrophils Relative % 72  43 - 77 %   Neutro Abs  5.5  1.7 - 7.7 K/uL   Lymphocytes Relative 17  12 - 46 %   Lymphs Abs 1.3  0.7 - 4.0 K/uL   Monocytes Relative 9  3 - 12 %   Monocytes Absolute 0.6  0.1 - 1.0 K/uL   Eosinophils Relative 2  0 - 5 %   Eosinophils Absolute 0.1  0.0 - 0.7 K/uL   Basophils Relative 0  0 - 1 %   Basophils Absolute 0.0  0.0 - 0.1 K/uL  COMPREHENSIVE METABOLIC PANEL     Status: Abnormal   Collection Time    05/29/13  2:55 PM      Result Value Range   Sodium 140  135 - 145 mEq/L   Potassium 3.8  3.5 - 5.1 mEq/L   Chloride 103  96 - 112 mEq/L   CO2 27  19 - 32 mEq/L   Glucose, Bld 100 (*) 70 - 99 mg/dL   BUN 6  6 - 23 mg/dL   Creatinine, Ser 5.62  0.50 - 1.10 mg/dL   Calcium 13.0  8.4 - 86.5 mg/dL   Total Protein 7.2  6.0 - 8.3 g/dL   Albumin 4.0  3.5 - 5.2 g/dL   AST 784 (*) 0 - 37 U/L   ALT 299 (*) 0 - 35 U/L   Alkaline Phosphatase 142 (*) 39 - 117 U/L   Total Bilirubin 6.3 (*) 0.3 - 1.2 mg/dL   GFR calc non Af Amer >90  >90 mL/min   GFR calc Af Amer >90  >90 mL/min   Comment: (NOTE)     The eGFR has been calculated using the CKD EPI equation.     This calculation has not been validated in all clinical situations.     eGFR's persistently <90 mL/min signify possible Chronic Kidney     Disease.  LIPASE, BLOOD     Status: None   Collection Time    05/29/13  2:55 PM      Result Value Range   Lipase 20  11 -  59 U/L  COMPREHENSIVE METABOLIC PANEL     Status: Abnormal   Collection Time    05/30/13  5:00 AM      Result Value Range   Sodium 141  135 - 145 mEq/L   Potassium 3.7  3.5 - 5.1 mEq/L   Chloride 105  96 - 112 mEq/L   CO2 28  19 - 32 mEq/L   Glucose, Bld 85  70 - 99 mg/dL   BUN 5 (*) 6 - 23 mg/dL   Creatinine, Ser 8.29  0.50 - 1.10 mg/dL   Calcium 9.4  8.4 - 56.2 mg/dL   Total Protein 6.0  6.0 - 8.3 g/dL   Albumin 3.3 (*) 3.5 - 5.2 g/dL   AST 90 (*) 0 - 37 U/L   ALT 212 (*) 0 - 35 U/L   Alkaline Phosphatase 119 (*) 39 - 117 U/L   Total Bilirubin 3.0 (*) 0.3 - 1.2 mg/dL   GFR calc non  Af Amer >90  >90 mL/min   GFR calc Af Amer >90  >90 mL/min   Comment: (NOTE)     The eGFR has been calculated using the CKD EPI equation.     This calculation has not been validated in all clinical situations.     eGFR's persistently <90 mL/min signify possible Chronic Kidney     Disease.  CBC     Status: None   Collection Time    05/30/13  5:00 AM      Result Value Range   WBC 8.5  4.0 - 10.5 K/uL   RBC 4.37  3.87 - 5.11 MIL/uL   Hemoglobin 14.1  12.0 - 15.0 g/dL   HCT 13.0  86.5 - 78.4 %   MCV 97.0  78.0 - 100.0 fL   MCH 32.3  26.0 - 34.0 pg   MCHC 33.3  30.0 - 36.0 g/dL   RDW 69.6  29.5 - 28.4 %   Platelets 204  150 - 400 K/uL  LIPASE, BLOOD     Status: None   Collection Time    05/30/13  5:00 AM      Result Value Range   Lipase 23  11 - 59 U/L    US Abdomen Complete  05/29/2013   *RADIOLOGY REPORT*  Clinical Data:  That the epigastric pain and right upper quadrant pain for 3 days  COMPLETE ABDOMINAL ULTRASOUND  Comparison:  None.  Findings:  Gallbladder:  There is gallbladder sludge, and there is a single calculus measuring 17 mm.  The wall is mildly prominent at 4 mm. There is no Murphy's sign.  Common bile duct:  Normal at under 5 mm.  Liver:  No focal lesion identified.  Within normal limits in parenchymal echogenicity.  IVC:  Appears normal.  Pancreas:  No focal abnormality seen.  Spleen:   normal, measuring 8.1 cm  Right Kidney:  Normal, measuring 10.5 cm  Left Kidney:  Normal, measuring 11.4 cm  Abdominal aorta:  No aneurysm identified.  IMPRESSION: Sludge and 17 mm gallstone with mild gallbladder wall thickening.Possibility of cholecystitis not excluded.   Original Report Authenticated By: Esperanza Heir, M.D.   Ct Abdomen Pelvis W Contrast  05/29/2013   CLINICAL DATA:  Abdominal pain and elevated liver function studies.  EXAM: CT ABDOMEN AND PELVIS WITH CONTRAST  TECHNIQUE: Multidetector CT imaging of the abdomen and pelvis was performed using the standard protocol  following bolus administration of intravenous contrast.  CONTRAST:  100 cc Omnipaque 300  COMPARISON:  Abdominal ultrasound 05/29/2013.  FINDINGS: The lung bases are clear. Minimal lingular there is a small hiatal hernia. Atelectasis.  There is diffuse fatty infiltration of the liver. No focal hepatic lesions. The gallbladder is distended with moderate wall thickening, gallstones and pericholecystic fluid consistent with the ultrasound findings of acute cholecystitis. The common bile duct measures 6 mm. The pancreas is normal. The spleen is normal. The adrenal glands and kidneys are normal.  The stomach, duodenum, small bowel and colon are unremarkable. No inflammatory changes, mass lesions or obstructive findings. The appendix is normal. No mesenteric or retroperitoneal mass or adenopathy. The aorta is normal.  The uterus and ovaries are normal. The bladder is normal. No pelvic mass, adenopathy or free pelvic fluid collections. No inguinal mass or adenopathy.  The bony structures are unremarkable.  IMPRESSION: CT findings confirm the ultrasound findings of acute cholecystitis. No obvious common bile duct stone. The common bile duct is upper limits of normal at 6 mm.   Electronically Signed   By: Loralie Champagne M.D.   On: 05/29/2013 23:46    ROS: See chart Blood pressure 111/72, pulse 43, temperature 97.7 F (36.5 C), temperature source Oral, resp. rate 16, height 5\' 9"  (1.753 m), weight 99.791 kg (220 lb), last menstrual period 04/01/2013, SpO2 100.00%. Physical Exam: Pleasant white female in no acute distress. Abdomen: Soft, nondistended. Slight tenderness to deep palpation in right upper quadrant. No hepatosplenomegaly, masses, or hernias identified.  Assessment/Plan: Impression: Cholelithiasis, jaundice Plan: We'll do MRCP today. If her common bile duct is clear, we'll proceed with laparoscopic cholecystectomy with cholangiograms tomorrow. Should she have choledocholithiasis, ERCP may be  indicated.  Alma Muegge A 05/30/2013, 9:48 AM

## 2013-05-31 ENCOUNTER — Encounter (HOSPITAL_COMMUNITY): Payer: Self-pay | Admitting: Anesthesiology

## 2013-05-31 ENCOUNTER — Encounter (HOSPITAL_COMMUNITY)
Admission: EM | Disposition: A | Discharge status: Home / Self Care | Payer: Self-pay | Source: Home / Self Care | Attending: General Surgery

## 2013-05-31 ENCOUNTER — Inpatient Hospital Stay (HOSPITAL_COMMUNITY): Payer: BC Managed Care – PPO

## 2013-05-31 ENCOUNTER — Inpatient Hospital Stay (HOSPITAL_COMMUNITY): Payer: BC Managed Care – PPO | Admitting: Anesthesiology

## 2013-05-31 DIAGNOSIS — F4310 Post-traumatic stress disorder, unspecified: Secondary | ICD-10-CM

## 2013-05-31 DIAGNOSIS — K8050 Calculus of bile duct without cholangitis or cholecystitis without obstruction: Secondary | ICD-10-CM

## 2013-05-31 DIAGNOSIS — R7989 Other specified abnormal findings of blood chemistry: Secondary | ICD-10-CM

## 2013-05-31 DIAGNOSIS — E039 Hypothyroidism, unspecified: Secondary | ICD-10-CM

## 2013-05-31 HISTORY — PX: CHOLECYSTECTOMY: SHX55

## 2013-05-31 HISTORY — PX: LAPAROSCOPIC CHOLECYSTECTOMY W/ CHOLANGIOGRAPHY: SUR757

## 2013-05-31 LAB — COMPREHENSIVE METABOLIC PANEL
BUN: 4 mg/dL — ABNORMAL LOW (ref 6–23)
CO2: 27 mEq/L (ref 19–32)
Chloride: 105 mEq/L (ref 96–112)
Creatinine, Ser: 0.68 mg/dL (ref 0.50–1.10)
GFR calc Af Amer: >90 mL/min (ref >90)
GFR calc non Af Amer: >90 mL/min (ref >90)
Glucose, Bld: 97 mg/dL (ref 70–99)
Potassium: 3.7 mEq/L (ref 3.5–5.1)
Total Bilirubin: 4.8 mg/dL — ABNORMAL HIGH (ref 0.3–1.2)

## 2013-05-31 SURGERY — LAPAROSCOPIC CHOLECYSTECTOMY WITH INTRAOPERATIVE CHOLANGIOGRAM
Anesthesia: General | Site: Abdomen | Wound class: Contaminated

## 2013-05-31 MED ORDER — GLUCAGON HCL (RDNA) 1 MG IJ SOLR
INTRAMUSCULAR | Status: DC | PRN
  Administered 2013-05-31: 1 mg via INTRAVENOUS

## 2013-05-31 MED ORDER — GLYCOPYRROLATE 0.2 MG/ML IJ SOLN
INTRAMUSCULAR | Status: AC
  Filled 2013-05-31: qty 3

## 2013-05-31 MED ORDER — LEVOTHYROXINE SODIUM 25 MCG PO TABS: 37.5000 ug | ORAL_TABLET | Freq: Every day | ORAL | Status: DC

## 2013-05-31 MED ORDER — IOHEXOL 350 MG/ML SOLN
INTRAVENOUS | Status: DC | PRN
  Administered 2013-05-31: 20 mL

## 2013-05-31 MED ORDER — HYDROMORPHONE HCL PF 1 MG/ML IJ SOLN
1.0000 mg | INTRAMUSCULAR | Status: DC | PRN
  Administered 2013-05-31 – 2013-06-01 (×4): 1 mg via INTRAVENOUS
  Filled 2013-05-31 (×4): qty 1

## 2013-05-31 MED ORDER — ROCURONIUM BROMIDE 50 MG/5ML IV SOLN
INTRAVENOUS | Status: AC
  Filled 2013-05-31: qty 1

## 2013-05-31 MED ORDER — FENTANYL CITRATE 0.05 MG/ML IJ SOLN
INTRAMUSCULAR | Status: AC
  Filled 2013-05-31: qty 5

## 2013-05-31 MED ORDER — PROPOFOL 10 MG/ML IV EMUL
INTRAVENOUS | Status: AC
  Filled 2013-05-31: qty 20

## 2013-05-31 MED ORDER — ROCURONIUM BROMIDE 100 MG/10ML IV SOLN
INTRAVENOUS | Status: DC | PRN
  Administered 2013-05-31: 5 mg via INTRAVENOUS
  Administered 2013-05-31: 25 mg via INTRAVENOUS
  Administered 2013-05-31: 15 mg via INTRAVENOUS
  Administered 2013-05-31: 5 mg via INTRAVENOUS

## 2013-05-31 MED ORDER — BUPIVACAINE HCL (PF) 0.5 % IJ SOLN
INTRAMUSCULAR | Status: AC
  Filled 2013-05-31: qty 30

## 2013-05-31 MED ORDER — LACTATED RINGERS IV SOLN
INTRAVENOUS | Status: DC | PRN
  Administered 2013-05-31 (×2): via INTRAVENOUS

## 2013-05-31 MED ORDER — GLYCOPYRROLATE 0.2 MG/ML IJ SOLN
INTRAMUSCULAR | Status: DC | PRN
  Administered 2013-05-31: 0.4 mg via INTRAVENOUS

## 2013-05-31 MED ORDER — LIDOCAINE HCL (CARDIAC) 20 MG/ML IV SOLN
INTRAVENOUS | Status: DC | PRN
  Administered 2013-05-31: 30 mg via INTRAVENOUS

## 2013-05-31 MED ORDER — OXYCODONE-ACETAMINOPHEN 5-325 MG PO TABS
1.0000 | ORAL_TABLET | ORAL | Status: DC | PRN
  Administered 2013-06-02: 2 via ORAL
  Filled 2013-05-31: qty 2
  Filled 2013-05-31: qty 1

## 2013-05-31 MED ORDER — DEXAMETHASONE SODIUM PHOSPHATE 4 MG/ML IJ SOLN
4.0000 mg | Freq: Once | INTRAMUSCULAR | Status: AC
  Administered 2013-05-31: 4 mg via INTRAVENOUS

## 2013-05-31 MED ORDER — NEOSTIGMINE METHYLSULFATE 1 MG/ML IJ SOLN
INTRAMUSCULAR | Status: AC
  Filled 2013-05-31: qty 1

## 2013-05-31 MED ORDER — 0.9 % SODIUM CHLORIDE (POUR BTL) OPTIME
TOPICAL | Status: DC | PRN
  Administered 2013-05-31: 1000 mL

## 2013-05-31 MED ORDER — LIDOCAINE HCL (PF) 1 % IJ SOLN
INTRAMUSCULAR | Status: AC
  Filled 2013-05-31: qty 5

## 2013-05-31 MED ORDER — SCOPOLAMINE 1 MG/3DAYS TD PT72
1.0000 | MEDICATED_PATCH | Freq: Once | TRANSDERMAL | Status: DC
  Administered 2013-05-31: 1.5 mg via TRANSDERMAL

## 2013-05-31 MED ORDER — ONDANSETRON HCL 4 MG/2ML IJ SOLN
INTRAMUSCULAR | Status: AC
  Filled 2013-05-31: qty 2

## 2013-05-31 MED ORDER — BUPIVACAINE HCL 0.5 % IJ SOLN
INTRAMUSCULAR | Status: DC | PRN
  Administered 2013-05-31: 10 mL

## 2013-05-31 MED ORDER — PROMETHAZINE HCL 25 MG/ML IJ SOLN
25.0000 mg | Freq: Four times a day (QID) | INTRAMUSCULAR | Status: DC | PRN
  Administered 2013-05-31 – 2013-06-01 (×2): 25 mg via INTRAVENOUS
  Filled 2013-05-31 (×2): qty 1

## 2013-05-31 MED ORDER — FENTANYL CITRATE 0.05 MG/ML IJ SOLN
INTRAMUSCULAR | Status: DC | PRN
  Administered 2013-05-31 (×10): 50 ug via INTRAVENOUS

## 2013-05-31 MED ORDER — SUCCINYLCHOLINE CHLORIDE 20 MG/ML IJ SOLN
INTRAMUSCULAR | Status: AC
  Filled 2013-05-31: qty 1

## 2013-05-31 MED ORDER — ATROPINE SULFATE 0.4 MG/ML IJ SOLN
INTRAMUSCULAR | Status: AC
  Filled 2013-05-31: qty 1

## 2013-05-31 MED ORDER — LEVOTHYROXINE SODIUM 75 MCG PO TABS
37.5000 ug | ORAL_TABLET | Freq: Every day | ORAL | Status: DC
  Administered 2013-05-31 – 2013-06-02 (×3): 37.5 ug via ORAL
  Filled 2013-05-31 (×3): qty 1

## 2013-05-31 MED ORDER — LACTATED RINGERS IV SOLN
INTRAVENOUS | Status: DC
  Administered 2013-05-31: 09:00:00 via INTRAVENOUS

## 2013-05-31 MED ORDER — NEOSTIGMINE METHYLSULFATE 1 MG/ML IJ SOLN
INTRAMUSCULAR | Status: DC | PRN
  Administered 2013-05-31: 2 mg via INTRAVENOUS

## 2013-05-31 MED ORDER — NICOTINE 14 MG/24HR TD PT24
14.0000 mg | MEDICATED_PATCH | Freq: Every day | TRANSDERMAL | Status: DC
  Filled 2013-05-31: qty 1

## 2013-05-31 MED ORDER — ATROPINE SULFATE 0.4 MG/ML IJ SOLN
INTRAMUSCULAR | Status: DC | PRN
  Administered 2013-05-31: .2 mg via INTRAVENOUS

## 2013-05-31 MED ORDER — FENTANYL CITRATE 0.05 MG/ML IJ SOLN
INTRAMUSCULAR | Status: AC
  Filled 2013-05-31: qty 2

## 2013-05-31 MED ORDER — FENTANYL CITRATE 0.05 MG/ML IJ SOLN
25.0000 ug | INTRAMUSCULAR | Status: DC | PRN
  Administered 2013-05-31 (×2): 50 ug via INTRAVENOUS

## 2013-05-31 MED ORDER — SODIUM CHLORIDE 0.9 % IV BOLUS (SEPSIS): 250.0000 mL | INTRAVENOUS | Status: DC | PRN

## 2013-05-31 MED ORDER — DEXAMETHASONE SODIUM PHOSPHATE 4 MG/ML IJ SOLN
INTRAMUSCULAR | Status: AC
  Filled 2013-05-31: qty 1

## 2013-05-31 MED ORDER — MIDAZOLAM HCL 2 MG/2ML IJ SOLN
1.0000 mg | INTRAMUSCULAR | Status: DC | PRN
  Administered 2013-05-31: 2 mg via INTRAVENOUS

## 2013-05-31 MED ORDER — ENOXAPARIN SODIUM 40 MG/0.4ML ~~LOC~~ SOLN: 40.0000 mg | SUBCUTANEOUS | Status: DC

## 2013-05-31 MED ORDER — KETOROLAC TROMETHAMINE 30 MG/ML IJ SOLN
30.0000 mg | Freq: Once | INTRAMUSCULAR | Status: AC
  Administered 2013-05-31: 30 mg via INTRAVENOUS
  Filled 2013-05-31: qty 1

## 2013-05-31 MED ORDER — GLUCAGON HCL (RDNA) 1 MG IJ SOLR
INTRAMUSCULAR | Status: AC
  Filled 2013-05-31: qty 1

## 2013-05-31 MED ORDER — LACTATED RINGERS IV SOLN
INTRAVENOUS | Status: DC
  Administered 2013-05-31 – 2013-06-02 (×5): via INTRAVENOUS

## 2013-05-31 MED ORDER — ONDANSETRON HCL 4 MG/2ML IJ SOLN: 4.0000 mg | Freq: Once | INTRAMUSCULAR | Status: DC | PRN

## 2013-05-31 MED ORDER — SCOPOLAMINE 1 MG/3DAYS TD PT72
MEDICATED_PATCH | TRANSDERMAL | Status: AC
  Filled 2013-05-31: qty 1

## 2013-05-31 MED ORDER — PROPOFOL 10 MG/ML IV BOLUS
INTRAVENOUS | Status: DC | PRN
  Administered 2013-05-31: 150 mg via INTRAVENOUS

## 2013-05-31 MED ORDER — HEMOSTATIC AGENTS (NO CHARGE) OPTIME
TOPICAL | Status: DC | PRN
  Administered 2013-05-31: 1 via TOPICAL

## 2013-05-31 MED ORDER — SUCCINYLCHOLINE CHLORIDE 20 MG/ML IJ SOLN
INTRAMUSCULAR | Status: DC | PRN
  Administered 2013-05-31: 120 mg via INTRAVENOUS

## 2013-05-31 MED ORDER — MIDAZOLAM HCL 2 MG/2ML IJ SOLN
INTRAMUSCULAR | Status: AC
  Filled 2013-05-31: qty 2

## 2013-05-31 MED ORDER — ONDANSETRON HCL 4 MG/2ML IJ SOLN
4.0000 mg | Freq: Once | INTRAMUSCULAR | Status: AC
  Administered 2013-05-31: 4 mg via INTRAVENOUS

## 2013-05-31 SURGICAL SUPPLY — 46 items
APPLIER CLIP LAPSCP 10X32 DD (CLIP) ×2 IMPLANT
BAG HAMPER (MISCELLANEOUS) ×2 IMPLANT
BAG SPEC RTRVL LRG 6X4 10 (ENDOMECHANICALS) ×1
BASKET LAP CBDE (MISCELLANEOUS) ×1 IMPLANT
CATH CHOLANGIOGRAM 4.5FR (CATHETERS) ×2 IMPLANT
CLOTH BEACON ORANGE TIMEOUT ST (SAFETY) ×2 IMPLANT
COVER LIGHT HANDLE STERIS (MISCELLANEOUS) ×4 IMPLANT
COVER MAYO STAND XLG (DRAPE) ×2 IMPLANT
DECANTER SPIKE VIAL GLASS SM (MISCELLANEOUS) ×2 IMPLANT
DRAPE C-ARM FOLDED MOBILE STRL (DRAPES) ×2 IMPLANT
DURAPREP 26ML APPLICATOR (WOUND CARE) ×2 IMPLANT
ELECT REM PT RETURN 9FT ADLT (ELECTROSURGICAL) ×2
ELECTRODE REM PT RTRN 9FT ADLT (ELECTROSURGICAL) ×1 IMPLANT
FILTER SMOKE EVAC LAPAROSHD (FILTER) ×2 IMPLANT
FORMALIN 10 PREFIL 120ML (MISCELLANEOUS) ×2 IMPLANT
GLOVE BIO SURGEON STRL SZ7.5 (GLOVE) ×2 IMPLANT
GLOVE BIOGEL PI IND STRL 7.0 (GLOVE) IMPLANT
GLOVE BIOGEL PI IND STRL 7.5 (GLOVE) IMPLANT
GLOVE BIOGEL PI INDICATOR 7.0 (GLOVE) ×3
GLOVE BIOGEL PI INDICATOR 7.5 (GLOVE) ×1
GLOVE ECLIPSE 7.0 STRL STRAW (GLOVE) ×1 IMPLANT
GLOVE SS BIOGEL STRL SZ 6.5 (GLOVE) IMPLANT
GLOVE SUPERSENSE BIOGEL SZ 6.5 (GLOVE) ×1
GOWN STRL REIN XL XLG (GOWN DISPOSABLE) ×6 IMPLANT
HEMOSTAT SNOW SURGICEL 2X4 (HEMOSTASIS) ×2 IMPLANT
INST SET LAPROSCOPIC AP (KITS) ×2 IMPLANT
KIT ROOM TURNOVER APOR (KITS) ×2 IMPLANT
MANIFOLD NEPTUNE II (INSTRUMENTS) ×2 IMPLANT
NS IRRIG 1000ML POUR BTL (IV SOLUTION) ×2 IMPLANT
PACK LAP CHOLE LZT030E (CUSTOM PROCEDURE TRAY) ×2 IMPLANT
PAD ARMBOARD 7.5X6 YLW CONV (MISCELLANEOUS) ×2 IMPLANT
POUCH SPECIMEN RETRIEVAL 10MM (ENDOMECHANICALS) ×2 IMPLANT
SET BASIN LINEN APH (SET/KITS/TRAYS/PACK) ×2 IMPLANT
SLEEVE ENDOPATH XCEL 5M (ENDOMECHANICALS) ×2 IMPLANT
SPONGE GAUZE 2X2 8PLY STRL LF (GAUZE/BANDAGES/DRESSINGS) ×8 IMPLANT
STAPLER VISISTAT (STAPLE) ×2 IMPLANT
SUT VICRYL 0 UR6 27IN ABS (SUTURE) ×2 IMPLANT
SYR 20CC LL (SYRINGE) ×2 IMPLANT
SYR 30ML LL (SYRINGE) ×2 IMPLANT
TAPE CLOTH SURG 4X10 WHT LF (GAUZE/BANDAGES/DRESSINGS) ×1 IMPLANT
TROCAR ENDO BLADELESS 11MM (ENDOMECHANICALS) ×2 IMPLANT
TROCAR XCEL NON-BLD 5MMX100MML (ENDOMECHANICALS) ×2 IMPLANT
TROCAR XCEL UNIV SLVE 11M 100M (ENDOMECHANICALS) ×2 IMPLANT
TUBING INSUFFLATION (TUBING) ×2 IMPLANT
WARMER LAPAROSCOPE (MISCELLANEOUS) ×2 IMPLANT
YANKAUER SUCT 12FT TUBE ARGYLE (SUCTIONS) ×2 IMPLANT

## 2013-05-31 NOTE — Progress Notes (Signed)
Awake. Moaning and groaning. Crying.

## 2013-05-31 NOTE — Op Note (Signed)
Patient:  [name] [name]  DOB:  03-14-1981  MRN:  [id number]   Preop Diagnosis:  Jaundice, cholelithiasis  Postop Diagnosis:  Same, choledocholithiasis  Procedure:  Laparoscopic cholecystectomy with common bile duct exploration  Surgeon:  Franky Macho, M.D.  Anes:  General endotracheal  Indications:  Patient is a 32 year old white female who presented emergency room with worsening upper abdominal pain and jaundice. Ultrasound of the gallbladder revealed cholelithiasis with a common bile duct at the upper limit of normal. MRCP was performed which was negative for choledocholithiasis. There was a question of ampullary edema. The patient now comes the operating room for laparoscopic cholecystectomy with cholangiograms. The risks and benefits of the procedure including bleeding, infection, hepatobiliary injury, the possibility of choledocholithiasis, and the possibility of an open procedure were fully explained to the patient, who gave informed consent.  Procedure note:  Patient is placed the supine position. After induction of general endotracheal anesthesia, the abdomen was prepped and draped using usual sterile technique with DuraPrep. Surgical site confirmation was performed.  A supraumbilical incision was made down to the fascia. A Veress needle was introduced into the abdominal cavity and confirmation of placement was done using the saline drop test. The abdomen was then insufflated to 16 mm mercury pressure. An 11 mm trocar was introduced into the abdominal cavity under direct visualization without difficulty. The patient was placed in reverse Trendelenburg position and additional 11 mm trocar was placed the epigastric region and 5 mm trochars were placed the right upper quadrant and right flank regions. The liver was inspected and noted within normal limits. The gallbladder was noted to be distended and edematous. The gallbladder was retracted in a dynamic fashion in order to expose the  triangle of Calot. The cystic duct was first identified. Its juncture to the infundibulum was fully identified. An incision was made into the cystic duct and the cholangiocatheter was inserted. Under digital fluoroscopy, a cholangiogram was performed. This did reveal a mildly dilated common bile duct and hepatobiliary tree with a filling defect in the distal portion. Glucagon was given. I attempted to remove the stone laparoscopically with a basket, but was unable to. It appeared to be impacted in the distal common bile duct. I also tried to flush the system, but the filling defect persisted. The cholangiocatheter was then removed and multiple endoclips were placed distally on the cystic duct and cystic duct was divided. The cystic artery was likewise ligated and divided. The gallbladder was freed away from the gallbladder fossa using Bovie electrocautery. The gallbladder was delivered through the epigastric are site using an Endo Catch bag. The gallbladder fossa was inspected and no abnormal bleeding or bile leakage was noted. Surgicel is placed the gallbladder fossa. All fluid and air were then evacuated from the abdominal cavity prior to removal of the trochars.  All wounds were irrigated normal saline. All wounds were injected with 0.5% Sensorcaine. The supraumbilical fascia as well as epigastric fascia reapproximated using 0 Vicryl interrupted sutures. All skin incisions were closed using staples. Betadine ointment and dry sterile dressings were applied.  All tape and needle counts were correct at the end of the procedure. Patient was extubated in the operating room and transferred to PACU in stable condition.  GI has been consulted and is aware of the choledocholithiasis and need for ERCP with stone extraction.  Complications:  None  EBL:  Minimal  Specimen:  Gallbladder

## 2013-05-31 NOTE — Progress Notes (Signed)
Subjective:  Post-op soreness. No specific complaints. Mother at bedside.   Objective: Vital signs in last 24 hours: Temp:  [97.3 F (36.3 C)-98.6 F (37 C)] 98.3 F (36.8 C) (10/01 1255) Pulse Rate:  [50-70] 66 (10/01 1334) Resp:  [13-25] 20 (10/01 1334) BP: (90-133)/(45-95) 104/64 mmHg (10/01 1334) SpO2:  [88 %-100 %] 96 % (10/01 1334) Last BM Date: 05/25/13 General:   Alert,  Well-developed, well-nourished, pleasant and cooperative in NAD Head:  Normocephalic and atraumatic. Eyes:  Sclera clear, no icterus.  Chest: CTA bilaterally without rales, rhonchi, crackles.    Heart:  Regular rate and rhythm; no murmurs, clicks, rubs,  or gallops. Abdomen:  Soft, nondistended.    Extremities:  Without clubbing, deformity or edema. Neurologic:  Alert and  oriented x4;  grossly normal neurologically. Skin:  Intact without significant lesions or rashes. Psych:  Alert and cooperative. Normal mood and affect.  Intake/Output from previous day: 09/30 0701 - 10/01 0700 In: 2946.7 [P.O.:480; I.V.:2316.7; IV Piggyback:150] Out: -  Intake/Output this shift: Total I/O In: 1600 [I.V.:1600] Out: 30 [Blood:30]  Lab Results: CBC  Recent Labs  05/29/13 1455 05/30/13 0500  WBC 7.6 8.5  HGB 15.6* 14.1  HCT 45.3 42.4  MCV 95.2 97.0  PLT 209 204   BMET  Recent Labs  05/29/13 1455 05/30/13 0500 05/31/13 0531  NA 140 141 139  K 3.8 3.7 3.7  CL 103 105 105  CO2 27 28 27   GLUCOSE 100* 85 97  BUN 6 5* 4*  CREATININE 0.62 0.56 0.68  CALCIUM 10.2 9.4 9.6   LFTs  Recent Labs  05/29/13 1455 05/30/13 0500 05/31/13 0531  BILITOT 6.3* 3.0* 4.8*  ALKPHOS 142* 119* 124*  AST 195* 90* 126*  ALT 299* 212* 237*  PROT 7.2 6.0 6.0  ALBUMIN 4.0 3.3* 3.2*    Recent Labs  05/29/13 1455 05/30/13 0500  LIPASE 20 23   PT/INR No results found for this basename: LABPROT, INR,  in the last 72 hours    Imaging Studies: Dg Cholangiogram Operative  05/31/2013   CLINICAL DATA:   Cholecystectomy for cholelithiasis and cholecystitis.  EXAM: INTRAOPERATIVE CHOLANGIOGRAM  TECHNIQUE: Cholangiographic images from the C-arm fluoroscopic device were submitted for interpretation post-operatively. Please see the procedural report for the amount of contrast and the fluoroscopy time utilized.  COMPARISON:  Recent ultrasound, CT and MRCP studies.  FINDINGS: Intraoperative imaging shows opacification of the common bile duct without evidence of filling defect. Contrast extends to the distal common bile duct and does not visibly opacified the duodenum on the single image submitted.  IMPRESSION: No obvious biliary filling defects. Lack of visualization of the duodenum   Electronically Signed   By: Irish Lack   On: 05/31/2013 12:55   US Abdomen Complete  05/29/2013   *RADIOLOGY REPORT*  Clinical Data:  That the epigastric pain and right upper quadrant pain for 3 days  COMPLETE ABDOMINAL ULTRASOUND  Comparison:  None.  Findings:  Gallbladder:  There is gallbladder sludge, and there is a single calculus measuring 17 mm.  The wall is mildly prominent at 4 mm. There is no Murphy's sign.  Common bile duct:  Normal at under 5 mm.  Liver:  No focal lesion identified.  Within normal limits in parenchymal echogenicity.  IVC:  Appears normal.  Pancreas:  No focal abnormality seen.  Spleen:   normal, measuring 8.1 cm  Right Kidney:  Normal, measuring 10.5 cm  Left Kidney:  Normal, measuring 11.4 cm  Abdominal  aorta:  No aneurysm identified.  IMPRESSION: Sludge and 17 mm gallstone with mild gallbladder wall thickening.Possibility of cholecystitis not excluded.   Original Report Authenticated By: Esperanza Heir, M.D.   Ct Abdomen Pelvis W Contrast  05/29/2013   CLINICAL DATA:  Abdominal pain and elevated liver function studies.  EXAM: CT ABDOMEN AND PELVIS WITH CONTRAST  TECHNIQUE: Multidetector CT imaging of the abdomen and pelvis was performed using the standard protocol following bolus administration of  intravenous contrast.  CONTRAST:  100 cc Omnipaque 300  COMPARISON:  Abdominal ultrasound 05/29/2013.  FINDINGS: The lung bases are clear. Minimal lingular there is a small hiatal hernia. Atelectasis.  There is diffuse fatty infiltration of the liver. No focal hepatic lesions. The gallbladder is distended with moderate wall thickening, gallstones and pericholecystic fluid consistent with the ultrasound findings of acute cholecystitis. The common bile duct measures 6 mm. The pancreas is normal. The spleen is normal. The adrenal glands and kidneys are normal.  The stomach, duodenum, small bowel and colon are unremarkable. No inflammatory changes, mass lesions or obstructive findings. The appendix is normal. No mesenteric or retroperitoneal mass or adenopathy. The aorta is normal.  The uterus and ovaries are normal. The bladder is normal. No pelvic mass, adenopathy or free pelvic fluid collections. No inguinal mass or adenopathy.  The bony structures are unremarkable.  IMPRESSION: CT findings confirm the ultrasound findings of acute cholecystitis. No obvious common bile duct stone. The common bile duct is upper limits of normal at 6 mm.   Electronically Signed   By: Loralie Champagne M.D.   On: 05/29/2013 23:46      Mr Jorja Loa Cm/mrcp  05/30/2013   CLINICAL DATA:  Jaundice  EXAM: MR 3D RECON AT SCANNER; MRI ABDOMEN (MRCP)  TECHNIQUE: Multiplanar multisequence MR imaging of the abdomen was performed, including heavily T2-weighted images of the biliary and pancreatic ducts. Three-dimensional MR images were rendered by post processing of the original MR data.  CONTRAST:  20mL MULTIHANCE GADOBENATE DIMEGLUMINE 529 MG/ML IV SOLN  COMPARISON:  CT 05/29/2013  FINDINGS: No pleural effusion identified. There is mild pericholecystic fluid. Stone within the gallbladder measures 1.3 cm, image 22/series 6. No significant intrahepatic bile duct dilatation. The common bile duct measures up to 6 mm. No choledocholithiasis identified.  Prominence of the ampulla is noted which measures up to 6 mm, image 20/series 5. There is no focal pancreatic abnormality identified. The spleen appears unremarkable.  The adrenal glands are both normal. The visualized portions of the kidneys are both unremarkable. No upper abdominal adenopathy identified. No ascites identified within the abdomen.  IMPRESSION: 1. Gallstone and pericholecystic fluid. Cannot rule out cholecystitis.  2. Common bile duct is upper limits of normal measuring 6 mm. No choledocholithiasis. There is prominence of the ampulla. This may represent edema secondary to recently passed stone. Ampullary neoplasm is considered less favored but would be difficult to exclude. If the patient's liver function tests do not normalize then consider further investigation with ERCP.   Electronically Signed   By: Signa Kell M.D.   On: 05/30/2013 12:12  [2 weeks]   Assessment: 32 y/o with acute cholecystitis s/p lap chole today. Positive intraoperative cholangiogram for distal CBD which could not be removed intraoperatively.  Plan: 1. ERCP tomorrow with stone extraction. Discussed at length with mother and patient.  I have discussed the risks, alternatives, benefits with regards to but not limited to the risk of reaction to medication, bleeding, infection, perforation, 10% risk of pancreatitis and  the patient is agreeable to proceed. Written consent to be obtained. 2. LFTs in AM as planned. 3. Will hold Lovenox for procedure.     LOS: 2 days   Tana Coast  05/31/2013, 2:29 PM   Attending note:  Patient seen and examined Discussed with doctors Lovell Sheehan and Tyron Russell. Cholangiogram reviewed.  Agree with above assessment and recommendations as outlined. Patient and patient's mother's questions answered. All parties agreeable.  Further recommendations to follow.

## 2013-05-31 NOTE — OR Nursing (Signed)
Eyes look jaundice,  Complaints of nausea.

## 2013-05-31 NOTE — Anesthesia Postprocedure Evaluation (Addendum)
  Anesthesia Post-op Note  Patient: [name] [name]  Procedure(s) Performed: Procedure(s): LAPAROSCOPIC CHOLECYSTECTOMY WITH INTRAOPERATIVE CHOLANGIOGRAM WITH COMMON BILE DUCT EXPLORATION (N/A)  Patient Location: PACU  Anesthesia Type:General  Level of Consciousness: awake and alert   Airway and Oxygen Therapy: Patient Spontanous Breathing and Patient connected to face mask oxygen  Post-op Pain: moderate  Post-op Assessment: Post-op Vital signs reviewed, Patient's Cardiovascular Status Stable, Respiratory Function Stable, Patent Airway and No signs of Nausea or vomiting  Post-op Vital Signs: Reviewed and stable, 105/57, 62, 16, SpO2 100, 36.7  Complications: No apparent anesthesia complications 06/01/13  Returned to OR today for ERCP, reports no concerns or complications related to yesterdays anesthetic.

## 2013-05-31 NOTE — Progress Notes (Signed)
[facility] PROGRESS NOTE  [name] [name] HYQ:[id number] DOB: [birth year] DOA: 05/29/2013 PCP: No PCP Per Patient   32 y.o. year-old female with history of PTSD, bipolar disorder, hypothyroidism who presents with abdominal pain and jaundice  Assessment/Plan  1. Acute cholecystitis with possible distal CBD stone; MRCP: Gallstone and pericholecystic fluid - Laparoscopic cholecystectomy with common bile duct exploration -  awaiting ERCP; Appreciate GI and Gen Surgery recommendations -  Diet per surgery; cont IVF, atx   2. Bipolar disorder, currently stable and holding oral medications -  Restart as soon as possible.   3. Hypothyroidism, holding synthroid -   Restart ASAP  4. Hypotension likely volume depletion; cont iVF;    Diet: per surgery  Access:  PIV IVF:  yes Proph:  SCD  Code Status: full  Family Communication: patient and her BF Disposition Plan:  Pending cholecystectomy   Consultants:  Gen surgery, Dr. Lovell Sheehan  GI  Procedures:  Abd [place]  CT abd/pelvis  Antibiotics:  Zosyn 9/29 >>   HPI/Subjective:  The patient states that she had some emesis overnight there was clear, however her nausea and abdominal pain have both been improving.  She is feeling much better..   Subjective: Filed Vitals:   05/31/13 1115 05/31/13 1125 05/31/13 1129 05/31/13 1140  BP: 94/54 90/52 97/58  123/75  Pulse: 55 61 63 63  Temp:  98 F (36.7 C)  97.8 F (36.6 C)  TempSrc:    Oral  Resp: 14 16 17 20   Height:      Weight:      SpO2: 94% 96% 95% 96%    Intake/Output Summary (Last 24 hours) at 05/31/13 1213 Last data filed at 05/31/13 1124  Gross per 24 hour  Intake 4546.67 ml  Output     30 ml  Net 4516.67 ml   Filed Weights   05/29/13 1402 05/29/13 1852  Weight: 99.791 kg (220 lb) 99.791 kg (220 lb)    Exam:   General:   adult female, No acute distress less jaundiced than yesterday   HEENT:  NCAT, MMM, persistent scleral icterus  Cardiovascular:  RRR,  nl S1, S2 no mrg, 2+ pulses, warm extremities  Respiratory:  CTAB, no increased WOB  Abdomen:   NABS, soft,  mild tenderness to palpation in the right upper cord and in epigastric area, no rebound or guarding, nondistended   MSK:   Normal tone and bulk, no LEE  Neuro:  Grossly intact  Data Reviewed: Basic Metabolic Panel:  Recent Labs Lab 05/29/13 1455 05/30/13 0500 05/31/13 0531  NA 140 141 139  K 3.8 3.7 3.7  CL 103 105 105  CO2 27 28 27   GLUCOSE 100* 85 97  BUN 6 5* 4*  CREATININE 0.62 0.56 0.68  CALCIUM 10.2 9.4 9.6   Liver Function Tests:  Recent Labs Lab 05/29/13 1455 05/30/13 0500 05/31/13 0531  AST 195* 90* 126*  ALT 299* 212* 237*  ALKPHOS 142* 119* 124*  BILITOT 6.3* 3.0* 4.8*  PROT 7.2 6.0 6.0  ALBUMIN 4.0 3.3* 3.2*    Recent Labs Lab 05/29/13 1455 05/30/13 0500  LIPASE 20 23   No results found for this basename: AMMONIA,  in the last 168 hours CBC:  Recent Labs Lab 05/29/13 1455 05/30/13 0500  WBC 7.6 8.5  NEUTROABS 5.5  --   HGB 15.6* 14.1  HCT 45.3 42.4  MCV 95.2 97.0  PLT 209 204   Cardiac Enzymes: No results found for this basename: CKTOTAL, CKMB, CKMBINDEX, TROPONINI,  in the last 168 hours BNP (last 3 results) No results found for this basename: PROBNP,  in the last 8760 hours CBG: No results found for this basename: GLUCAP,  in the last 168 hours  Recent Results (from the past 240 hour(s))  SURGICAL PCR SCREEN     Status: None   Collection Time    05/30/13  6:30 PM      Result Value Range Status   MRSA, PCR NEGATIVE  NEGATIVE Final   Staphylococcus aureus NEGATIVE  NEGATIVE Final   Comment:            The Xpert SA Assay (FDA     approved for NASAL specimens     in patients over 54 years of age),     is one component of     a comprehensive surveillance     program.  Test performance has     been validated by [employer] for patients greater     than or equal to 7 year old.     It is not intended     to  diagnose infection nor to     guide or monitor treatment.     Studies: US Abdomen Complete  05/29/2013   *RADIOLOGY REPORT*  Clinical Data:  That the epigastric pain and right upper quadrant pain for 3 days  COMPLETE ABDOMINAL ULTRASOUND  Comparison:  None.  Findings:  Gallbladder:  There is gallbladder sludge, and there is a single calculus measuring 17 mm.  The wall is mildly prominent at 4 mm. There is no Murphy's sign.  Common bile duct:  Normal at under 5 mm.  Liver:  No focal lesion identified.  Within normal limits in parenchymal echogenicity.  IVC:  Appears normal.  Pancreas:  No focal abnormality seen.  Spleen:   normal, measuring 8.1 cm  Right Kidney:  Normal, measuring 10.5 cm  Left Kidney:  Normal, measuring 11.4 cm  Abdominal aorta:  No aneurysm identified.  IMPRESSION: Sludge and 17 mm gallstone with mild gallbladder wall thickening.Possibility of cholecystitis not excluded.   Original Report Authenticated By: Esperanza Heir, M.D.   Ct Abdomen Pelvis W Contrast  05/29/2013   CLINICAL DATA:  Abdominal pain and elevated liver function studies.  EXAM: CT ABDOMEN AND PELVIS WITH CONTRAST  TECHNIQUE: Multidetector CT imaging of the abdomen and pelvis was performed using the standard protocol following bolus administration of intravenous contrast.  CONTRAST:  100 cc Omnipaque 300  COMPARISON:  Abdominal ultrasound 05/29/2013.  FINDINGS: The lung bases are clear. Minimal lingular there is a small hiatal hernia. Atelectasis.  There is diffuse fatty infiltration of the liver. No focal hepatic lesions. The gallbladder is distended with moderate wall thickening, gallstones and pericholecystic fluid consistent with the ultrasound findings of acute cholecystitis. The common bile duct measures 6 mm. The pancreas is normal. The spleen is normal. The adrenal glands and kidneys are normal.  The stomach, duodenum, small bowel and colon are unremarkable. No inflammatory changes, mass lesions or obstructive  findings. The appendix is normal. No mesenteric or retroperitoneal mass or adenopathy. The aorta is normal.  The uterus and ovaries are normal. The bladder is normal. No pelvic mass, adenopathy or free pelvic fluid collections. No inguinal mass or adenopathy.  The bony structures are unremarkable.  IMPRESSION: CT findings confirm the ultrasound findings of acute cholecystitis. No obvious common bile duct stone. The common bile duct is upper limits of normal at 6 mm.   Electronically Signed  By: Loralie Champagne M.D.   On: 05/29/2013 23:46   Mr 3d Recon At Scanner  05/30/2013   CLINICAL DATA:  Jaundice  EXAM: MR 3D RECON AT SCANNER; MRI ABDOMEN (MRCP)  TECHNIQUE: Multiplanar multisequence MR imaging of the abdomen was performed, including heavily T2-weighted images of the biliary and pancreatic ducts. Three-dimensional MR images were rendered by post processing of the original MR data.  CONTRAST:  20mL MULTIHANCE GADOBENATE DIMEGLUMINE 529 MG/ML IV SOLN  COMPARISON:  CT 05/29/2013  FINDINGS: No pleural effusion identified. There is mild pericholecystic fluid. Stone within the gallbladder measures 1.3 cm, image 22/series 6. No significant intrahepatic bile duct dilatation. The common bile duct measures up to 6 mm. No choledocholithiasis identified. Prominence of the ampulla is noted which measures up to 6 mm, image 20/series 5. There is no focal pancreatic abnormality identified. The spleen appears unremarkable.  The adrenal glands are both normal. The visualized portions of the kidneys are both unremarkable. No upper abdominal adenopathy identified. No ascites identified within the abdomen.  IMPRESSION: 1. Gallstone and pericholecystic fluid. Cannot rule out cholecystitis.  2. Common bile duct is upper limits of normal measuring 6 mm. No choledocholithiasis. There is prominence of the ampulla. This may represent edema secondary to recently passed stone. Ampullary neoplasm is considered less favored but would be  difficult to exclude. If the patient's liver function tests do not normalize then consider further investigation with ERCP.   Electronically Signed   By: Signa Kell M.D.   On: 05/30/2013 12:12   Mr Jorja Loa Cm/mrcp  05/30/2013   CLINICAL DATA:  Jaundice  EXAM: MR 3D RECON AT SCANNER; MRI ABDOMEN (MRCP)  TECHNIQUE: Multiplanar multisequence MR imaging of the abdomen was performed, including heavily T2-weighted images of the biliary and pancreatic ducts. Three-dimensional MR images were rendered by post processing of the original MR data.  CONTRAST:  20mL MULTIHANCE GADOBENATE DIMEGLUMINE 529 MG/ML IV SOLN  COMPARISON:  CT 05/29/2013  FINDINGS: No pleural effusion identified. There is mild pericholecystic fluid. Stone within the gallbladder measures 1.3 cm, image 22/series 6. No significant intrahepatic bile duct dilatation. The common bile duct measures up to 6 mm. No choledocholithiasis identified. Prominence of the ampulla is noted which measures up to 6 mm, image 20/series 5. There is no focal pancreatic abnormality identified. The spleen appears unremarkable.  The adrenal glands are both normal. The visualized portions of the kidneys are both unremarkable. No upper abdominal adenopathy identified. No ascites identified within the abdomen.  IMPRESSION: 1. Gallstone and pericholecystic fluid. Cannot rule out cholecystitis.  2. Common bile duct is upper limits of normal measuring 6 mm. No choledocholithiasis. There is prominence of the ampulla. This may represent edema secondary to recently passed stone. Ampullary neoplasm is considered less favored but would be difficult to exclude. If the patient's liver function tests do not normalize then consider further investigation with ERCP.   Electronically Signed   By: Signa Kell M.D.   On: 05/30/2013 12:12    Scheduled Meds: . [START ON 06/01/2013] enoxaparin (LOVENOX) injection  40 mg Subcutaneous Q24H  . glucagon      . [START ON 06/01/2013] levothyroxine   37.5 mcg Oral QAC breakfast  . nicotine  14 mg Transdermal Daily  . pantoprazole  40 mg Oral Daily  . piperacillin-tazobactam (ZOSYN)  IV  3.375 g Intravenous Q8H   Continuous Infusions: . lactated ringers 100 mL/hr at 05/31/13 1143    Principal Problem:   Cholestatic jaundice Active Problems:  Bipolar affective disorder, depressed   PTSD (post-traumatic stress disorder)   Hypothyroidism   Transaminitis   Gallstones   Nausea and vomiting   Abdominal pain, epigastric    Time spent: 30 min    Esperanza Sheets  [facility] Pager [phone]. If 7PM-7AM, please contact night-coverage at [email or web address], password [facility] 05/31/2013, 12:13 PM  LOS: 2 days

## 2013-05-31 NOTE — Anesthesia Procedure Notes (Signed)
Procedure Name: Intubation Date/Time: 05/31/2013 8:54 AM Performed by: Glynn Octave E Pre-anesthesia Checklist: Patient identified, Patient being monitored, Timeout performed, Emergency Drugs available and Suction available Patient Re-evaluated:Patient Re-evaluated prior to inductionOxygen Delivery Method: Circle System Utilized Preoxygenation: Pre-oxygenation with 100% oxygen Intubation Type: IV induction and Rapid sequence Laryngoscope Size: Mac and 3 Grade View: Grade I Tube type: Oral Tube size: 7.0 mm Number of attempts: 1 Airway Equipment and Method: stylet Placement Confirmation: ETT inserted through vocal cords under direct vision,  positive ETCO2 and breath sounds checked- equal and bilateral Secured at: 22 cm Tube secured with: Tape Dental Injury: Teeth and Oropharynx as per pre-operative assessment

## 2013-05-31 NOTE — Anesthesia Preprocedure Evaluation (Addendum)
Anesthesia Evaluation  Patient identified by MRN, date of birth, ID band Patient awake  General Assessment Comment:Beta blocker prescribed for non cardiac reasons, heart rate 53.  Reviewed: Allergy & Precautions, H&P , NPO status , Patient's Chart, lab work & pertinent test results, reviewed documented beta blocker date and time   Airway Mallampati: I TM Distance: >3 FB     Dental  (+) Teeth Intact   Pulmonary Current Smoker (am cough),  breath sounds clear to auscultation        Cardiovascular negative cardio ROS  Rhythm:Regular Rate:Normal     Neuro/Psych PSYCHIATRIC DISORDERS (PTSD) Anxiety Depression Bipolar Disorder    GI/Hepatic (+)     substance abuse  alcohol use,   Endo/Other  Hypothyroidism   Renal/GU      Musculoskeletal   Abdominal   Peds  Hematology   Anesthesia Other Findings RUQ pain & nausea  Reproductive/Obstetrics                          Anesthesia Physical Anesthesia Plan  ASA: II  Anesthesia Plan: General   Post-op Pain Management:    Induction: Intravenous, Rapid sequence and Cricoid pressure planned  Airway Management Planned: Oral ETT  Additional Equipment:   Intra-op Plan:   Post-operative Plan: Extubation in OR  Informed Consent: I have reviewed the patients History and Physical, chart, labs and discussed the procedure including the risks, benefits and alternatives for the proposed anesthesia with the patient or authorized representative who has indicated his/her understanding and acceptance.     Plan Discussed with:   Anesthesia Plan Comments:         Anesthesia Quick Evaluation

## 2013-05-31 NOTE — Transfer of Care (Signed)
Immediate Anesthesia Transfer of Care Note  Patient: [name] [name]  Procedure(s) Performed: Procedure(s): LAPAROSCOPIC CHOLECYSTECTOMY WITH INTRAOPERATIVE CHOLANGIOGRAM WITH COMMON BILE DUCT EXPLORATION (N/A)  Patient Location: PACU  Anesthesia Type:General  Level of Consciousness: awake  Airway & Oxygen Therapy: Patient Spontanous Breathing and Patient connected to face mask oxygen  Post-op Assessment: Report given to PACU RN  Post vital signs: Reviewed  Complications: No apparent anesthesia complications

## 2013-05-31 NOTE — Progress Notes (Signed)
Awake. C/O postop abd pain and right shoulder pain. Rates pain 10. Med as noted.

## 2013-06-01 ENCOUNTER — Encounter (HOSPITAL_COMMUNITY)
Admission: EM | Disposition: A | Discharge status: Home / Self Care | Payer: Self-pay | Source: Home / Self Care | Attending: General Surgery

## 2013-06-01 ENCOUNTER — Encounter (HOSPITAL_COMMUNITY): Payer: Self-pay | Admitting: *Deleted

## 2013-06-01 ENCOUNTER — Inpatient Hospital Stay (HOSPITAL_COMMUNITY): Payer: BC Managed Care – PPO | Admitting: Certified Registered"

## 2013-06-01 ENCOUNTER — Encounter (HOSPITAL_COMMUNITY): Payer: Self-pay | Admitting: Certified Registered"

## 2013-06-01 ENCOUNTER — Inpatient Hospital Stay (HOSPITAL_COMMUNITY): Payer: BC Managed Care – PPO

## 2013-06-01 DIAGNOSIS — K8050 Calculus of bile duct without cholangitis or cholecystitis without obstruction: Secondary | ICD-10-CM

## 2013-06-01 HISTORY — PX: SPHINCTEROTOMY: SHX5544

## 2013-06-01 HISTORY — PX: ERCP: SHX5425

## 2013-06-01 HISTORY — PX: REMOVAL OF STONES: SHX5545

## 2013-06-01 LAB — CBC
HCT: 38.8 % (ref 36.0–46.0)
Hemoglobin: 13.2 g/dL (ref 12.0–15.0)
MCHC: 34 g/dL (ref 30.0–36.0)
RBC: 4.05 MIL/uL (ref 3.87–5.11)
WBC: 15.7 10*3/uL — ABNORMAL HIGH (ref 4.0–10.5)

## 2013-06-01 LAB — BASIC METABOLIC PANEL
BUN: 5 mg/dL — ABNORMAL LOW (ref 6–23)
CO2: 28 mEq/L (ref 19–32)
Calcium: 9.7 mg/dL (ref 8.4–10.5)
Chloride: 100 mEq/L (ref 96–112)
Glucose, Bld: 131 mg/dL — ABNORMAL HIGH (ref 70–99)
Sodium: 137 mEq/L (ref 135–145)

## 2013-06-01 LAB — HEPATIC FUNCTION PANEL
AST: 127 U/L — ABNORMAL HIGH (ref 0–37)
Albumin: 3.4 g/dL — ABNORMAL LOW (ref 3.5–5.2)
Alkaline Phosphatase: 135 U/L — ABNORMAL HIGH (ref 39–117)
Total Bilirubin: 3.9 mg/dL — ABNORMAL HIGH (ref 0.3–1.2)

## 2013-06-01 LAB — PHOSPHORUS: Phosphorus: 3.6 mg/dL (ref 2.3–4.6)

## 2013-06-01 LAB — MAGNESIUM: Magnesium: 1.7 mg/dL (ref 1.5–2.5)

## 2013-06-01 SURGERY — ERCP, WITH INTERVENTION IF INDICATED
Anesthesia: General

## 2013-06-01 SURGERY — ERCP, WITH INTERVENTION IF INDICATED
Anesthesia: Monitor Anesthesia Care

## 2013-06-01 SURGERY — ERCP, WITH INTERVENTION IF INDICATED
Anesthesia: General | Site: Mouth

## 2013-06-01 MED ORDER — SODIUM CHLORIDE 0.9 % IV SOLN: INTRAVENOUS | Status: DC

## 2013-06-01 MED ORDER — FENTANYL CITRATE 0.05 MG/ML IJ SOLN
50.0000 ug | INTRAMUSCULAR | Status: DC | PRN
  Administered 2013-06-01 – 2013-06-02 (×4): 50 ug via INTRAVENOUS
  Filled 2013-06-01 (×4): qty 2

## 2013-06-01 MED ORDER — GLYCOPYRROLATE 0.2 MG/ML IJ SOLN
0.2000 mg | Freq: Once | INTRAMUSCULAR | Status: AC
  Administered 2013-06-01: 0.2 mg via INTRAVENOUS

## 2013-06-01 MED ORDER — FENTANYL CITRATE 0.05 MG/ML IJ SOLN
INTRAMUSCULAR | Status: AC
  Filled 2013-06-01: qty 2

## 2013-06-01 MED ORDER — LIDOCAINE HCL 1 % IJ SOLN
INTRAMUSCULAR | Status: DC | PRN
  Administered 2013-06-01: 40 mg via INTRADERMAL

## 2013-06-01 MED ORDER — MIDAZOLAM HCL 2 MG/2ML IJ SOLN
1.0000 mg | INTRAMUSCULAR | Status: DC | PRN
  Administered 2013-06-01: 2 mg via INTRAVENOUS

## 2013-06-01 MED ORDER — PROPOFOL 10 MG/ML IV BOLUS
INTRAVENOUS | Status: DC | PRN
  Administered 2013-06-01: 200 mg via INTRAVENOUS

## 2013-06-01 MED ORDER — LACTATED RINGERS IV SOLN: INTRAVENOUS | Status: DC

## 2013-06-01 MED ORDER — FENTANYL CITRATE 0.05 MG/ML IJ SOLN
INTRAMUSCULAR | Status: DC | PRN
  Administered 2013-06-01: 25 ug via INTRAVENOUS
  Administered 2013-06-01: 50 ug via INTRAVENOUS

## 2013-06-01 MED ORDER — GLYCOPYRROLATE 0.2 MG/ML IJ SOLN
INTRAMUSCULAR | Status: AC
  Filled 2013-06-01: qty 1

## 2013-06-01 MED ORDER — ONDANSETRON HCL 4 MG/2ML IJ SOLN
INTRAMUSCULAR | Status: AC
  Filled 2013-06-01: qty 2

## 2013-06-01 MED ORDER — LIDOCAINE HCL (PF) 1 % IJ SOLN
INTRAMUSCULAR | Status: AC
  Filled 2013-06-01: qty 5

## 2013-06-01 MED ORDER — IOHEXOL 350 MG/ML SOLN
INTRAVENOUS | Status: DC | PRN
  Administered 2013-06-01: 11:00:00

## 2013-06-01 MED ORDER — MIDAZOLAM HCL 2 MG/2ML IJ SOLN
INTRAMUSCULAR | Status: AC
  Filled 2013-06-01: qty 2

## 2013-06-01 MED ORDER — PROPOFOL 10 MG/ML IV EMUL
INTRAVENOUS | Status: AC
  Filled 2013-06-01: qty 20

## 2013-06-01 MED ORDER — SUCCINYLCHOLINE CHLORIDE 20 MG/ML IJ SOLN
INTRAMUSCULAR | Status: DC | PRN
  Administered 2013-06-01: 100 mg via INTRAVENOUS

## 2013-06-01 MED ORDER — ONDANSETRON HCL 4 MG/2ML IJ SOLN: 4.0000 mg | Freq: Once | INTRAMUSCULAR | Status: DC | PRN

## 2013-06-01 MED ORDER — STERILE WATER FOR IRRIGATION IR SOLN
Status: DC | PRN
  Administered 2013-06-01: 11:00:00

## 2013-06-01 MED ORDER — FENTANYL CITRATE 0.05 MG/ML IJ SOLN: 25.0000 ug | INTRAMUSCULAR | Status: DC | PRN

## 2013-06-01 MED ORDER — SUCCINYLCHOLINE CHLORIDE 20 MG/ML IJ SOLN
INTRAMUSCULAR | Status: AC
  Filled 2013-06-01: qty 1

## 2013-06-01 MED ORDER — ONDANSETRON HCL 4 MG/2ML IJ SOLN
4.0000 mg | Freq: Once | INTRAMUSCULAR | Status: AC
  Administered 2013-06-01: 4 mg via INTRAVENOUS

## 2013-06-01 MED ORDER — GLUCAGON HCL (RDNA) 1 MG IJ SOLR
INTRAMUSCULAR | Status: AC
  Filled 2013-06-01: qty 2

## 2013-06-01 SURGICAL SUPPLY — 27 items
BALLN RETRIEVAL 12X15 (BALLOONS) ×1 IMPLANT
BALN RTRVL 200 6-7FR 12-15 (BALLOONS) ×1
BASKET TRAPEZOID 3X6 (MISCELLANEOUS) IMPLANT
BASKET TRAPEZOID LITHO 2.5X5 (MISCELLANEOUS) IMPLANT
BLOCK BITE 60FR ADLT L/F BLUE (MISCELLANEOUS) ×1 IMPLANT
BSKT STON RTRVL TRAPEZOID 3X6 (MISCELLANEOUS)
DEVICE INFLATION ENCORE 26 (MISCELLANEOUS) IMPLANT
DEVICE LOCKING W-BIOPSY CAP (MISCELLANEOUS) ×2 IMPLANT
FLOOR PAD 36X40 (MISCELLANEOUS) ×2
GUIDEWIRE HYDRA JAGWIRE .35 (WIRE) IMPLANT
GUIDEWIRE JAG HINI 025X260CM (WIRE) IMPLANT
NDL HYPO 18GX1.5 BLUNT FILL (NEEDLE) IMPLANT
NEEDLE HYPO 18GX1.5 BLUNT FILL (NEEDLE) IMPLANT
PAD FLOOR 36X40 (MISCELLANEOUS) IMPLANT
SNARE ROTATE MED OVAL 20MM (MISCELLANEOUS) IMPLANT
SNARE SHORT THROW 27M MED OVAL (MISCELLANEOUS) IMPLANT
SPHINCTEROTOME AUTOTOME .25 (MISCELLANEOUS) IMPLANT
SPHINCTEROTOME HYDRATOME 44 (MISCELLANEOUS) ×4 IMPLANT
SPONGE GAUZE 4X4 12PLY (GAUZE/BANDAGES/DRESSINGS) ×1 IMPLANT
SYR 20CC LL (SYRINGE) ×1 IMPLANT
SYR 3ML LL SCALE MARK (SYRINGE) IMPLANT
SYR 50ML LL SCALE MARK (SYRINGE) ×2 IMPLANT
SYSTEM CONTINUOUS INJECTION (MISCELLANEOUS) ×2 IMPLANT
TUBING ENDO SMARTCAP PENTAX (MISCELLANEOUS) ×1 IMPLANT
WALLSTENT METAL COVERED 10X60 (STENTS) IMPLANT
WALLSTENT METAL COVERED 10X80 (STENTS) IMPLANT
WATER STERILE IRR 1000ML POUR (IV SOLUTION) ×2 IMPLANT

## 2013-06-01 NOTE — Transfer of Care (Signed)
Immediate Anesthesia Transfer of Care Note  Patient: [name] [name]  Procedure(s) Performed: Procedure(s): ENDOSCOPIC RETROGRADE CHOLANGIOPANCREATOGRAPHY (ERCP) (N/A) SPHINCTEROTOMY (N/A) REMOVAL OF STONES (N/A)  Patient Location: PACU  Anesthesia Type:General  Level of Consciousness: awake, alert  and oriented  Airway & Oxygen Therapy: Patient Spontanous Breathing and Patient connected to face mask oxygen  Post-op Assessment: Report given to PACU RN  Post vital signs: Reviewed and stable  Complications: No apparent anesthesia complications

## 2013-06-01 NOTE — Anesthesia Procedure Notes (Signed)
Procedure Name: Intubation Date/Time: 06/01/2013 10:22 AM Performed by: Glendora Score A Pre-anesthesia Checklist: Patient identified, Emergency Drugs available, Suction available and Patient being monitored Patient Re-evaluated:Patient Re-evaluated prior to inductionOxygen Delivery Method: Circle system utilized Preoxygenation: Pre-oxygenation with 100% oxygen Intubation Type: IV induction, Rapid sequence and Cricoid Pressure applied Ventilation: Mask ventilation without difficulty Laryngoscope Size: Miller and 2 Grade View: Grade I Tube type: Oral Tube size: 7.0 mm Number of attempts: 1 Airway Equipment and Method: Stylet Placement Confirmation: ETT inserted through vocal cords under direct vision,  positive ETCO2 and breath sounds checked- equal and bilateral Secured at: 22 cm Tube secured with: Tape Dental Injury: Teeth and Oropharynx as per pre-operative assessment

## 2013-06-01 NOTE — Op Note (Signed)
[name] [name], [name] [name]            ACCOUNT NO.:  [id number]  MEDICAL RECORD NO.:  [id number]  LOCATION:  [facility]                          FACILITY:  [facility]  PHYSICIAN:  R. Roetta Sessions, MD FACP FACGDATE OF BIRTH:  Dec 12, 1980  DATE OF PROCEDURE:  06/01/2013 DATE OF DISCHARGE:                              OPERATIVE REPORT   NAME OF PROCEDURE:  ERCP with biliary sphincterotomy, stone extraction.  INDICATIONS FOR PROCEDURE:  A 32 year old lady with recent jaundice and cholelithiasis.  Status post laparoscopic cholecystectomy yesterday. Intraoperative cholangiogram demonstrated a meniscus and apparent filling defect in the distal common bile duct.  LFTs are elevated.  MRCP was negative for convincing evidence of a common duct stone.  ERCP is now being done to decompress her bile duct.  Risks, benefits, limitations, alternatives, imponderables have been discussed with patient's family on multiple occasions.  I specifically talked about the risk of pancreatitis, bleeding, perforation, potential for failed procedure and/or the remote possibility of a stent which would necessitate a subsequent procedure. All questions answered, all parties agreeable.  PROCEDURE NOTE:  Endotracheal induced was by Dr. Marcos Eke and associates.  Patient received a dose of IV Zosyn prior to the procedure.  INSTRUMENT:  Pentax video chip system.  FINDINGS:  Cursory examination of the distal esophagus and stomach revealed no abnormalities.  Pylorus was easily traversed with the scope. The ampulla of Vater was readily identified on the medial wall of the second portion of the duodenum.  Scope was pulled back to the short position 55 cm from the incisors.  Scout film was taken.  Utilizing a Microvasive sphincterotome, the ampulla was approached.  Using guidewire palpation, I almost immediately achieved deep biliary cannulation. The sphincterotome followed up.  Cholangiogram was performed.  Bile duct was not dilated.   However, there was a filling defect in the distal common bile duct.  No evidence of extravasation.  Placing and maintaining the safety wire deep in the biliary tree, the sphincterotome was pulled back across the ampullary orifice at 12 o'clock position. Using the Erbe unit, a 1 cm sphincterotomy was performed at the 12 o'clock position.  This was done without difficulty or apparent complication.  There was a minimal amount of bleeding with this maneuver which ceased spontaneously.  Subsequently, the sphincterotome was railed off and a graduated biliary extraction balloon was advanced to the area of the confluence.  It was inflated to accommodate the size of the bile duct, and a balloon occlusion cholangiogram was performed.  With this maneuver, a cholesterol gallstone was delivered through the ampullary orifice with a small amount of sludge.  Please see photographs. Subsequently, this maneuver was repeated without recovery of any other calculi.  There was seen excellent drainage of the biliary tree following this maneuver.  The pancreatic duct was not injected or manipulated in any way.  Patient tolerated the procedure well, was taken to PACU in stable condition.  IMPRESSION: 1. Choledocholithiasis, status post biliary sphincterotomy and balloon     stone extraction as described above. 2. Status post cholecystectomy. 3. Pancreatic duct not injected.  RECOMMENDATIONS: 1. Clear liquid diet that will advance later as tolerated. 2. Check LFTs tomorrow morning.  Anticipate discharge within the  next     24 hours per Dr. Lovell Sheehan.     Jonathon Bellows, MD Caleen Essex     RMR/MEDQ  D:  06/01/2013  T:  06/01/2013  Job:  [id number]  cc:   Jonette Eva, M.D. [address] [place] , [place] [address]  Dalia Heading, M.D. Fax: [phone]

## 2013-06-01 NOTE — Anesthesia Postprocedure Evaluation (Signed)
  Anesthesia Post-op Note  Patient: [name] [name]  Procedure(s) Performed: Procedure(s): ENDOSCOPIC RETROGRADE CHOLANGIOPANCREATOGRAPHY (ERCP) (N/A) SPHINCTEROTOMY (N/A) REMOVAL OF STONES (N/A)  Patient Location: PACU  Anesthesia Type:General  Level of Consciousness: awake, alert  and oriented  Airway and Oxygen Therapy: Patient Spontanous Breathing and Patient connected to face mask oxygen  Post-op Pain: none  Post-op Assessment: Post-op Vital signs reviewed, Patient's Cardiovascular Status Stable, Respiratory Function Stable, Patent Airway and No signs of Nausea or vomiting  Post-op Vital Signs: Reviewed and stable  Complications: No apparent anesthesia complications

## 2013-06-01 NOTE — Anesthesia Preprocedure Evaluation (Addendum)
Anesthesia Evaluation  Patient identified by MRN, date of birth, ID band Patient awake  General Assessment Comment:Beta blocker prescribed for non cardiac reasons, heart rate 53.  Reviewed: Allergy & Precautions, H&P , NPO status , Patient's Chart, lab work & pertinent test results, reviewed documented beta blocker date and time   Airway Mallampati: I TM Distance: >3 FB     Dental  (+) Teeth Intact   Pulmonary Current Smoker (am cough),  breath sounds clear to auscultation        Cardiovascular negative cardio ROS  Rhythm:Regular Rate:Normal     Neuro/Psych PSYCHIATRIC DISORDERS (PTSD) Anxiety Depression Bipolar Disorder    GI/Hepatic (+)     substance abuse  alcohol use,   Endo/Other  Hypothyroidism   Renal/GU      Musculoskeletal   Abdominal   Peds  Hematology   Anesthesia Other Findings Beta blocker has been d/c'd.  Reproductive/Obstetrics                          Anesthesia Physical Anesthesia Plan  ASA: II  Anesthesia Plan: General   Post-op Pain Management:    Induction: Intravenous, Rapid sequence and Cricoid pressure planned  Airway Management Planned: Oral ETT  Additional Equipment:   Intra-op Plan:   Post-operative Plan: Extubation in OR  Informed Consent: I have reviewed the patients History and Physical, chart, labs and discussed the procedure including the risks, benefits and alternatives for the proposed anesthesia with the patient or authorized representative who has indicated his/her understanding and acceptance.     Plan Discussed with:   Anesthesia Plan Comments:         Anesthesia Quick Evaluation

## 2013-06-01 NOTE — Progress Notes (Signed)
UR Chart Review Completed  

## 2013-06-01 NOTE — Progress Notes (Signed)
1 Day Post-Op  Subjective: Is having significant nausea and vomiting postoperatively. This may be due to Dilaudid.  Objective: Vital signs in last 24 hours: Temp:  [97.6 F (36.4 C)-98.4 F (36.9 C)] 98.4 F (36.9 C) (10/02 0531) Pulse Rate:  [55-83] 83 (10/02 0531) Resp:  [13-20] 16 (10/02 0531) BP: (90-133)/(45-95) 127/91 mmHg (10/02 0531) SpO2:  [88 %-100 %] 96 % (10/02 0531) Last BM Date: 06/24/13  Intake/Output from previous day: 10/01 0701 - 10/02 0700 In: 2178.3 [I.V.:2178.3] Out: 30 [Blood:30] Intake/Output this shift:    General appearance: alert, cooperative and fatigued Resp: clear to auscultation bilaterally Cardio: regular rate and rhythm, S1, S2 normal, no murmur, click, rub or gallop GI: Soft. Dressings dry and intact.  Lab Results:   Recent Labs  05/30/13 0500 06/01/13 0702  WBC 8.5 15.7*  HGB 14.1 13.2  HCT 42.4 38.8  PLT 204 210   BMET  Recent Labs  05/31/13 0531 06/01/13 0702  NA 139 137  K 3.7 4.0  CL 105 100  CO2 27 28  GLUCOSE 97 131*  BUN 4* 5*  CREATININE 0.68 0.56  CALCIUM 9.6 9.7   PT/INR No results found for this basename: LABPROT, INR,  in the last 72 hours  Studies/Results: Dg Cholangiogram Operative  05/31/2013   ADDENDUM REPORT: 05/31/2013 14:58  ADDENDUM: Additional images have become available that were not available at the time of initial cholangiogram interpretation. These images now clearly show persistent obstruction at the level of the distal CBD with meniscus present at the level of a filling defect, likely representing a distal CBD calculus. The common bile duct is mildly dilated.   Electronically Signed   By: Irish Lack   On: 05/31/2013 14:58   05/31/2013   CLINICAL DATA:  Cholecystectomy for cholelithiasis and cholecystitis.  EXAM: INTRAOPERATIVE CHOLANGIOGRAM  TECHNIQUE: Cholangiographic images from the C-arm fluoroscopic device were submitted for interpretation post-operatively. Please see the procedural  report for the amount of contrast and the fluoroscopy time utilized.  COMPARISON:  Recent ultrasound, CT and MRCP studies.  FINDINGS: Intraoperative imaging shows opacification of the common bile duct without evidence of filling defect. Contrast extends to the distal common bile duct and does not visibly opacified the duodenum on the single image submitted.  IMPRESSION: No obvious biliary filling defects. Lack of visualization of the duodenum  Electronically Signed: By: Irish Lack On: 05/31/2013 12:55   Mr 3d Recon At Scanner  05/30/2013   CLINICAL DATA:  Jaundice  EXAM: MR 3D RECON AT SCANNER; MRI ABDOMEN (MRCP)  TECHNIQUE: Multiplanar multisequence MR imaging of the abdomen was performed, including heavily T2-weighted images of the biliary and pancreatic ducts. Three-dimensional MR images were rendered by post processing of the original MR data.  CONTRAST:  20mL MULTIHANCE GADOBENATE DIMEGLUMINE 529 MG/ML IV SOLN  COMPARISON:  CT 05/29/2013  FINDINGS: No pleural effusion identified. There is mild pericholecystic fluid. Stone within the gallbladder measures 1.3 cm, image 22/series 6. No significant intrahepatic bile duct dilatation. The common bile duct measures up to 6 mm. No choledocholithiasis identified. Prominence of the ampulla is noted which measures up to 6 mm, image 20/series 5. There is no focal pancreatic abnormality identified. The spleen appears unremarkable.  The adrenal glands are both normal. The visualized portions of the kidneys are both unremarkable. No upper abdominal adenopathy identified. No ascites identified within the abdomen.  IMPRESSION: 1. Gallstone and pericholecystic fluid. Cannot rule out cholecystitis.  2. Common bile duct is upper limits of normal  measuring 6 mm. No choledocholithiasis. There is prominence of the ampulla. This may represent edema secondary to recently passed stone. Ampullary neoplasm is considered less favored but would be difficult to exclude. If the  patient's liver function tests do not normalize then consider further investigation with ERCP.   Electronically Signed   By: Signa Kell M.D.   On: 05/30/2013 12:12   Mr Jorja Loa Cm/mrcp  05/30/2013   CLINICAL DATA:  Jaundice  EXAM: MR 3D RECON AT SCANNER; MRI ABDOMEN (MRCP)  TECHNIQUE: Multiplanar multisequence MR imaging of the abdomen was performed, including heavily T2-weighted images of the biliary and pancreatic ducts. Three-dimensional MR images were rendered by post processing of the original MR data.  CONTRAST:  20mL MULTIHANCE GADOBENATE DIMEGLUMINE 529 MG/ML IV SOLN  COMPARISON:  CT 05/29/2013  FINDINGS: No pleural effusion identified. There is mild pericholecystic fluid. Stone within the gallbladder measures 1.3 cm, image 22/series 6. No significant intrahepatic bile duct dilatation. The common bile duct measures up to 6 mm. No choledocholithiasis identified. Prominence of the ampulla is noted which measures up to 6 mm, image 20/series 5. There is no focal pancreatic abnormality identified. The spleen appears unremarkable.  The adrenal glands are both normal. The visualized portions of the kidneys are both unremarkable. No upper abdominal adenopathy identified. No ascites identified within the abdomen.  IMPRESSION: 1. Gallstone and pericholecystic fluid. Cannot rule out cholecystitis.  2. Common bile duct is upper limits of normal measuring 6 mm. No choledocholithiasis. There is prominence of the ampulla. This may represent edema secondary to recently passed stone. Ampullary neoplasm is considered less favored but would be difficult to exclude. If the patient's liver function tests do not normalize then consider further investigation with ERCP.   Electronically Signed   By: Signa Kell M.D.   On: 05/30/2013 12:12    Anti-infectives: Anti-infectives   Start     Dose/Rate Route Frequency Ordered Stop   05/30/13 0200  piperacillin-tazobactam (ZOSYN) IVPB 3.375 g     3.375 g 12.5 mL/hr over 240  Minutes Intravenous Every 8 hours 05/29/13 1935     05/29/13 1800  piperacillin-tazobactam (ZOSYN) IVPB 3.375 g     3.375 g 12.5 mL/hr over 240 Minutes Intravenous  Once 05/29/13 1749 05/29/13 1839      Assessment/Plan: s/p Procedure(s): LAPAROSCOPIC CHOLECYSTECTOMY WITH INTRAOPERATIVE CHOLANGIOGRAM WITH COMMON BILE DUCT EXPLORATION Impression: Status post laparoscopic cholecystectomy with intraoperative cholangiograms, common bile duct exploration. Choledocholithiasis was seen. Liver tests reviewed. Plan: Will switch from dilaudid to fentanyl. For ERCP with stone extraction today by Dr. Jena Gauss.  LOS: 3 days    Karlene Southard A 06/01/2013

## 2013-06-02 ENCOUNTER — Encounter (HOSPITAL_COMMUNITY): Payer: Self-pay | Admitting: Internal Medicine

## 2013-06-02 ENCOUNTER — Other Ambulatory Visit: Payer: Self-pay

## 2013-06-02 ENCOUNTER — Telehealth: Payer: Self-pay | Admitting: Gastroenterology

## 2013-06-02 DIAGNOSIS — R945 Abnormal results of liver function studies: Secondary | ICD-10-CM

## 2013-06-02 LAB — BASIC METABOLIC PANEL
CO2: 29 mEq/L (ref 19–32)
Chloride: 104 mEq/L (ref 96–112)
GFR calc Af Amer: >90 mL/min (ref >90)
Potassium: 3.4 mEq/L — ABNORMAL LOW (ref 3.5–5.1)
Sodium: 141 mEq/L (ref 135–145)

## 2013-06-02 LAB — CBC
MCH: 32.5 pg (ref 26.0–34.0)
Platelets: 199 10*3/uL (ref 150–400)
RBC: 3.66 MIL/uL — ABNORMAL LOW (ref 3.87–5.11)
RDW: 13.4 % (ref 11.5–15.5)
WBC: 9.9 10*3/uL (ref 4.0–10.5)

## 2013-06-02 LAB — HEPATIC FUNCTION PANEL
ALT: 197 U/L — ABNORMAL HIGH (ref 0–35)
AST: 85 U/L — ABNORMAL HIGH (ref 0–37)
Albumin: 3 g/dL — ABNORMAL LOW (ref 3.5–5.2)
Alkaline Phosphatase: 116 U/L (ref 39–117)
Total Protein: 5.5 g/dL — ABNORMAL LOW (ref 6.0–8.3)

## 2013-06-02 MED ORDER — OXYCODONE-ACETAMINOPHEN 7.5-325 MG PO TABS: 1.0000 | ORAL_TABLET | ORAL | Status: DC | PRN

## 2013-06-02 NOTE — Telephone Encounter (Signed)
Patient needs LFTs done Wednesday of next week. Likely will be discharged today. Dx: abnormal LFTs.

## 2013-06-02 NOTE — Progress Notes (Signed)
Patient received discharge instructions along with follow up appointments and prescriptions. Patient verbalized understanding of all instructions. Patient was escorted by staff via wheelchair to vehicle. Patient discharged to home in stable condition. 

## 2013-06-02 NOTE — Telephone Encounter (Signed)
Order faxed to [facility].

## 2013-06-02 NOTE — Discharge Summary (Signed)
Physician Discharge Summary  Patient ID: [name] [name] MRN: [id number] DOB/AGE: August 08, 1981 32 y.o.  Admit date: 05/29/2013 Discharge date: 06/02/2013  Admission Diagnoses: Cholecystitis, cholelithiasis, jaundice  Discharge Diagnoses: Same, choledocholithiasis Principal Problem:   Cholestatic jaundice Active Problems:   Bipolar affective disorder, depressed   PTSD (post-traumatic stress disorder)   Hypothyroidism   Transaminitis   Gallstones   Nausea and vomiting   Abdominal pain, epigastric   Discharged Condition: good  Hospital Course: Patient is a 32 year old white female who presented emergency room with worsening upper abdominal pain, nausea, and jaundice.  Her LFTs were elevated, but there was no evidence of pancreatitis. Ultrasound the gallbladder revealed a single gallstone. Her common bile duct was mildly elevated. She was admitted to the hospital for further evaluation treatment. CT scan of the abdomen and pelvis did not reveal any significant choledocholithiasis or hepatobiliary dilatation. MRCP was performed which revealed a common bile duct within normal limits and no choledocholithiasis. She subsequent a.m. a laparoscopic cholecystectomy with common bile duct exploration for an impacted stone within the distal common bile duct. It could not be removed surgically. An ERCP with stone extraction was done the following day. She tolerated both procedures well. Her liver enzyme tests have significantly improved. Her white blood cell count is normal. She is being discharged home in good and improving condition.  Consults: GI and general surgery  Treatments: surgery: Laparoscopic cholecystectomy with common bile duct exploration on 05/31/2013 ERCP with stone extraction on 06/01/2013  Discharge Exam: Blood pressure 98/62, pulse 76, temperature 98.7 F (37.1 C), temperature source Oral, resp. rate 20, height 5\' 9"  (1.753 m), weight 99.791 kg (220 lb), last menstrual period  05/05/2013, SpO2 94.00%. General appearance: alert, cooperative and no distress Resp: clear to auscultation bilaterally Cardio: regular rate and rhythm, S1, S2 normal, no murmur, click, rub or gallop GI: Soft. Incisions healing well. Epigastric ecchymosis resolving. No hematoma.  Disposition: 01-Home or Self Care     Medication List         carbamazepine 200 MG tablet  Commonly known as:  TEGRETOL  Take 1 tablet (200 mg total) by mouth 4 (four) times daily.     citalopram 20 MG tablet  Commonly known as:  CELEXA  Take 1 tablet (20 mg total) by mouth every morning.     levothyroxine 25 MCG tablet  Commonly known as:  SYNTHROID, LEVOTHROID  Take 37.5 mcg by mouth daily before breakfast.     oxyCODONE-acetaminophen 7.5-325 MG per tablet  Commonly known as:  PERCOCET  Take 1-2 tablets by mouth every 4 (four) hours as needed.     propranolol 10 MG tablet  Commonly known as:  INDERAL  Take 1 tablet (10 mg total) by mouth 3 (three) times daily.           Follow-up Information   Follow up with Dalia Heading, MD. Schedule an appointment as soon as possible for a visit on 06/08/2013.   Specialty:  General Surgery   Contact information:   [address] [place] [address] [phone]       Signed: Franky Macho A 06/02/2013, 9:04 AM

## 2013-06-02 NOTE — Progress Notes (Signed)
Subjective:  Feels better. No N/V.   Objective: Vital signs in last 24 hours: Temp:  [98 F (36.7 C)-98.9 F (37.2 C)] 98.7 F (37.1 C) (10/03 0455) Pulse Rate:  [56-80] 76 (10/03 0455) Resp:  [19-48] 20 (10/03 0455) BP: (97-144)/(56-94) 98/62 mmHg (10/03 0455) SpO2:  [84 %-100 %] 94 % (10/03 0455) Last BM Date: 05/25/13 General:   Alert,  Well-developed, well-nourished, pleasant and cooperative in NAD Head:  Normocephalic and atraumatic. Eyes:  Sclera clear, no icterus.   Abdomen:  Soft Extremities:  Without clubbing, deformity or edema. Neurologic:  Alert and  oriented x4;  grossly normal neurologically. Skin:  Intact without significant lesions or rashes. Psych:  Alert and cooperative. Normal mood and affect.  Intake/Output from previous day: 10/02 0701 - 10/03 0700 In: 3240 [P.O.:120; I.V.:2870; IV Piggyback:250] Out: -  Intake/Output this shift:    Lab Results: CBC  Recent Labs  06/01/13 0702 06/02/13 0527  WBC 15.7* 9.9  HGB 13.2 11.9*  HCT 38.8 35.8*  MCV 95.8 97.8  PLT 210 199   BMET  Recent Labs  05/31/13 0531 06/01/13 0702 06/02/13 0527  NA 139 137 141  K 3.7 4.0 3.4*  CL 105 100 104  CO2 27 28 29   GLUCOSE 97 131* 89  BUN 4* 5* 5*  CREATININE 0.68 0.56 0.60  CALCIUM 9.6 9.7 9.0   LFTs  Recent Labs  05/31/13 0531 06/01/13 0702 06/02/13 0527  BILITOT 4.8* 3.9* 2.3*  BILIDIR  --  2.8* 1.3*  IBILI  --  1.1* 1.0*  ALKPHOS 124* 135* 116  AST 126* 127* 85*  ALT 237* 269* 197*  PROT 6.0 6.1 5.5*  ALBUMIN 3.2* 3.4* 3.0*   No results found for this basename: LIPASE,  in the last 72 hours PT/INR No results found for this basename: LABPROT, INR,  in the last 72 hours    Imaging Studies:    Dg Ercp With Sphincterotomy  06/01/2013   CLINICAL DATA:  Status post cholecystectomy yesterday with intraoperative cholangiogram demonstrating evidence of distal CBD occlusion by a probable calculus.  EXAM: ERCP  TECHNIQUE: Multiple spot images  obtained with the fluoroscopic device and submitted for interpretation post-procedure.  COMPARISON:  Intraoperative cholangiogram on 05/31/2013  FINDINGS: The procedure was performed by Dr. Benard Rink. Submitted imaging with a C-arm demonstrates cannulation of the common bile duct and cholangiogram demonstrating suggestion of filling defect in the distal common bile duct. A balloon extraction maneuver was performed after sphincterotomy.  IMPRESSION: Filling defect in the distal common bile duct consistent with choledocholithiasis. Balloon extraction was performed during the procedure.  These images were submitted for radiologic interpretation only. Please see the procedural report for the amount of contrast and the fluoroscopy time utilized.   Electronically Signed   By: Irish Lack   On: 06/01/2013 14:06     Assessment: 32 y/o female with acute cholecystitis s/p lap chole 05/31/13. Underwent ERCP with sphinterotomy and stone extraction 06/01/13 for choledocholithiasis discovered at time of IOC. Feels better this AM. Just sore. No N/V. LFTs trending downward.  Plan: 1. Recheck LFTs middle of next week. We will make arrangements.  2. Stable for D/C from GI standpoint. Will sign off. Thanks for consult.    LOS: 4 days   Tana Coast  06/02/2013, 8:33 AM

## 2013-06-05 NOTE — Telephone Encounter (Signed)
Pt is aware to go get her labs done Wed at [facility].

## 2013-06-08 LAB — HEPATIC FUNCTION PANEL
Alkaline Phosphatase: 148 U/L — ABNORMAL HIGH (ref 39–117)
Indirect Bilirubin: 1.4 mg/dL — ABNORMAL HIGH (ref 0.0–0.9)
Total Bilirubin: 2.1 mg/dL — ABNORMAL HIGH (ref 0.3–1.2)
Total Protein: 7.3 g/dL (ref 6.0–8.3)

## 2013-06-14 ENCOUNTER — Other Ambulatory Visit: Payer: Self-pay

## 2013-06-14 DIAGNOSIS — R945 Abnormal results of liver function studies: Secondary | ICD-10-CM

## 2013-06-14 NOTE — Progress Notes (Signed)
Quick Note:  Pt called back and is aware. ______ 

## 2013-06-14 NOTE — Progress Notes (Signed)
Quick Note:  LMOM to call. Lab order faxed to [facility]. ______

## 2013-06-22 LAB — HEPATIC FUNCTION PANEL
ALT: 33 U/L (ref 0–35)
Alkaline Phosphatase: 119 U/L — ABNORMAL HIGH (ref 39–117)
Bilirubin, Direct: 0.2 mg/dL (ref 0.0–0.3)
Indirect Bilirubin: 0.4 mg/dL (ref 0.0–0.9)
Total Bilirubin: 0.6 mg/dL (ref 0.3–1.2)
Total Protein: 6.7 g/dL (ref 6.0–8.3)

## 2013-06-25 NOTE — Progress Notes (Signed)
Quick Note:  Please let patient know labs are good. ______ 

## 2013-07-03 ENCOUNTER — Ambulatory Visit (INDEPENDENT_AMBULATORY_CARE_PROVIDER_SITE_OTHER): Payer: BC Managed Care – PPO | Admitting: Psychiatry

## 2013-07-03 ENCOUNTER — Encounter (HOSPITAL_COMMUNITY): Payer: Self-pay | Admitting: Psychiatry

## 2013-07-03 VITALS — BP 110/70 | Ht 69.0 in | Wt 214.0 lb

## 2013-07-03 DIAGNOSIS — F411 Generalized anxiety disorder: Secondary | ICD-10-CM

## 2013-07-03 DIAGNOSIS — F3130 Bipolar disorder, current episode depressed, mild or moderate severity, unspecified: Secondary | ICD-10-CM

## 2013-07-03 DIAGNOSIS — F4310 Post-traumatic stress disorder, unspecified: Secondary | ICD-10-CM

## 2013-07-03 DIAGNOSIS — F429 Obsessive-compulsive disorder, unspecified: Secondary | ICD-10-CM

## 2013-07-03 MED ORDER — CITALOPRAM HYDROBROMIDE 20 MG PO TABS: 20.0000 mg | ORAL_TABLET | Freq: Every morning | ORAL | Status: AC
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
## 2014-06-17 ENCOUNTER — Encounter (HOSPITAL_COMMUNITY): Payer: Self-pay | Admitting: Emergency Medicine

## 2014-06-17 ENCOUNTER — Emergency Department (HOSPITAL_COMMUNITY)
Admission: EM | Admit: 2014-06-17 | Discharge: 2014-06-17 | Disposition: A | Discharge status: Home / Self Care | Payer: BC Managed Care – PPO | Attending: Emergency Medicine | Admitting: Emergency Medicine

## 2014-06-17 DIAGNOSIS — Z8719 Personal history of other diseases of the digestive system: Secondary | ICD-10-CM | POA: Insufficient documentation

## 2014-06-17 DIAGNOSIS — L239 Allergic contact dermatitis, unspecified cause: Secondary | ICD-10-CM | POA: Insufficient documentation

## 2014-06-17 DIAGNOSIS — F319 Bipolar disorder, unspecified: Secondary | ICD-10-CM | POA: Insufficient documentation

## 2014-06-17 DIAGNOSIS — E039 Hypothyroidism, unspecified: Secondary | ICD-10-CM | POA: Insufficient documentation

## 2014-06-17 DIAGNOSIS — Z87891 Personal history of nicotine dependence: Secondary | ICD-10-CM | POA: Insufficient documentation

## 2014-06-17 DIAGNOSIS — F419 Anxiety disorder, unspecified: Secondary | ICD-10-CM | POA: Insufficient documentation

## 2014-06-17 DIAGNOSIS — Z79899 Other long term (current) drug therapy: Secondary | ICD-10-CM | POA: Insufficient documentation

## 2014-06-17 MED ORDER — PREDNISONE 10 MG PO TABS: 20.0000 mg | ORAL_TABLET | Freq: Every day | ORAL | Status: DC

## 2014-06-17 MED ORDER — DIPHENHYDRAMINE HCL 50 MG/ML IJ SOLN
50.0000 mg | Freq: Once | INTRAMUSCULAR | Status: AC
  Administered 2014-06-17: 50 mg via INTRAVENOUS
  Filled 2014-06-17: qty 1

## 2014-06-17 MED ORDER — DIPHENHYDRAMINE HCL 50 MG/ML IJ SOLN
25.0000 mg | Freq: Once | INTRAMUSCULAR | Status: AC
  Administered 2014-06-17: 25 mg via INTRAVENOUS
  Filled 2014-06-17: qty 1

## 2014-06-17 MED ORDER — FAMOTIDINE 20 MG PO TABS: 20.0000 mg | ORAL_TABLET | Freq: Two times a day (BID) | ORAL | Status: DC

## 2014-06-17 MED ORDER — FAMOTIDINE IN NACL 20-0.9 MG/50ML-% IV SOLN
20.0000 mg | Freq: Once | INTRAVENOUS | Status: AC
  Administered 2014-06-17: 20 mg via INTRAVENOUS
  Filled 2014-06-17: qty 50

## 2014-06-17 MED ORDER — METHYLPREDNISOLONE SODIUM SUCC 125 MG IJ SOLR
125.0000 mg | Freq: Once | INTRAMUSCULAR | Status: AC
  Administered 2014-06-17: 125 mg via INTRAVENOUS
  Filled 2014-06-17: qty 2

## 2014-06-17 MED ORDER — EPINEPHRINE 0.3 MG/0.3ML IJ SOAJ
0.3000 mg | Freq: Once | INTRAMUSCULAR | Status: AC
  Administered 2014-06-17: 0.3 mg via INTRAMUSCULAR
  Filled 2014-06-17: qty 0.3
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
## 2014-06-17 NOTE — Discharge Instructions (Signed)
Take benadryl for itching and rash

## 2014-06-17 NOTE — ED Notes (Signed)
Rash with whelts to back, arms, and legs, taking benadryl at home without relief.  Pt does not know what she might be reacting to

## 2014-12-22 ENCOUNTER — Emergency Department: Admit: 2014-12-22 | Disposition: A | Discharge status: Home / Self Care | Payer: Self-pay | Admitting: Student

## 2015-10-10 ENCOUNTER — Emergency Department
Admission: EM | Admit: 2015-10-10 | Discharge: 2015-10-10 | Disposition: A | Discharge status: Home / Self Care | Payer: Self-pay | Attending: Emergency Medicine | Admitting: Emergency Medicine

## 2015-10-10 ENCOUNTER — Emergency Department: Payer: Self-pay

## 2015-10-10 ENCOUNTER — Encounter: Payer: Self-pay | Admitting: Emergency Medicine

## 2015-10-10 DIAGNOSIS — R0789 Other chest pain: Secondary | ICD-10-CM | POA: Insufficient documentation

## 2015-10-10 DIAGNOSIS — Z79899 Other long term (current) drug therapy: Secondary | ICD-10-CM | POA: Insufficient documentation

## 2015-10-10 DIAGNOSIS — J40 Bronchitis, not specified as acute or chronic: Secondary | ICD-10-CM | POA: Insufficient documentation

## 2015-10-10 DIAGNOSIS — Z87891 Personal history of nicotine dependence: Secondary | ICD-10-CM | POA: Insufficient documentation

## 2015-10-10 DIAGNOSIS — Z3202 Encounter for pregnancy test, result negative: Secondary | ICD-10-CM | POA: Insufficient documentation

## 2015-10-10 DIAGNOSIS — Z7952 Long term (current) use of systemic steroids: Secondary | ICD-10-CM | POA: Insufficient documentation

## 2015-10-10 LAB — CBC
HCT: 45.5 % (ref 35.0–47.0)
Hemoglobin: 15.1 g/dL (ref 12.0–16.0)
MCH: 31.9 pg (ref 26.0–34.0)
MCHC: 33.1 g/dL (ref 32.0–36.0)
MCV: 96.5 fL (ref 80.0–100.0)
Platelets: 228 10*3/uL (ref 150–440)
RBC: 4.72 MIL/uL (ref 3.80–5.20)
RDW: 13.4 % (ref 11.5–14.5)
WBC: 12.2 10*3/uL — ABNORMAL HIGH (ref 3.6–11.0)

## 2015-10-10 LAB — BASIC METABOLIC PANEL
ANION GAP: 9 (ref 5–15)
BUN: 11 mg/dL (ref 6–20)
CALCIUM: 9.3 mg/dL (ref 8.9–10.3)
CO2: 25 mmol/L (ref 22–32)
Chloride: 107 mmol/L (ref 101–111)
Creatinine, Ser: 0.65 mg/dL (ref 0.44–1.00)
GLUCOSE: 82 mg/dL (ref 65–99)
Potassium: 3.6 mmol/L (ref 3.5–5.1)
Sodium: 141 mmol/L (ref 135–145)

## 2015-10-10 LAB — TROPONIN I: Troponin I: <0.03 ng/mL (ref <0.031)

## 2015-10-10 LAB — POCT PREGNANCY, URINE: Preg Test, Ur: NEGATIVE

## 2015-10-10 LAB — FIBRIN DERIVATIVES D-DIMER (ARMC ONLY): FIBRIN DERIVATIVES D-DIMER (ARMC): 246 (ref 0–499)

## 2015-10-10 MED ORDER — BENZONATATE 100 MG PO CAPS: 100.0000 mg | ORAL_CAPSULE | Freq: Four times a day (QID) | ORAL | Status: AC | PRN

## 2015-10-10 NOTE — ED Notes (Signed)
Pt reports chest pain x3 days; worse last night, woke her up from sleep and radiated to back.

## 2015-10-10 NOTE — Discharge Instructions (Signed)
Please seek medical attention for any high fevers, chest pain, shortness of breath, change in behavior, persistent vomiting, bloody stool or any other new or concerning symptoms. ° ° °Upper Respiratory Infection, Adult °Most upper respiratory infections (URIs) are caused by a virus. A URI affects the nose, throat, and upper air passages. The most common type of URI is often called "the common cold." °HOME CARE  °· Take medicines only as told by your doctor. °· Gargle warm saltwater or take cough drops to comfort your throat as told by your doctor. °· Use a warm mist humidifier or inhale steam from a shower to increase air moisture. This may make it easier to breathe. °· Drink enough fluid to keep your pee (urine) clear or pale yellow. °· Eat soups and other clear broths. °· Have a healthy diet. °· Rest as needed. °· Go back to work when your fever is gone or your doctor says it is okay. °¨ You may need to stay home longer to avoid giving your URI to others. °¨ You can also wear a face mask and wash your hands often to prevent spread of the virus. °· Use your inhaler more if you have asthma. °· Do not use any tobacco products, including cigarettes, chewing tobacco, or electronic cigarettes. If you need help quitting, ask your doctor. °GET HELP IF: °· You are getting worse, not better. °· Your symptoms are not helped by medicine. °· You have chills. °· You are getting more short of breath. °· You have brown or red mucus. °· You have yellow or brown discharge from your nose. °· You have pain in your face, especially when you bend forward. °· You have a fever. °· You have puffy (swollen) neck glands. °· You have pain while swallowing. °· You have white areas in the back of your throat. °GET HELP RIGHT AWAY IF:  °· You have very bad or constant: °¨ Headache. °¨ Ear pain. °¨ Pain in your forehead, behind your eyes, and over your cheekbones (sinus pain). °¨ Chest pain. °· You have long-lasting (chronic) lung disease and  any of the following: °¨ Wheezing. °¨ Long-lasting cough. °¨ Coughing up blood. °¨ A change in your usual mucus. °· You have a stiff neck. °· You have changes in your: °¨ Vision. °¨ Hearing. °¨ Thinking. °¨ Mood. °MAKE SURE YOU:  °· Understand these instructions. °· Will watch your condition. °· Will get help right away if you are not doing well or get worse. °  °This information is not intended to replace advice given to you by your health care provider. Make sure you discuss any questions you have with your health care provider. °  °Document Released: 02/03/2008 Document Revised: 01/01/2015 Document Reviewed: 11/22/2013 °Elsevier Interactive Patient Education ©2016 [employer] ° °

## 2015-10-10 NOTE — ED Provider Notes (Signed)
[facility] Emergency Department Provider Note    ____________________________________________  Time seen: ~1915  I have reviewed the triage vital signs and the nursing notes.   HISTORY  Chief Complaint Chest Pain   History limited by: Not Limited   HPI [name] [name] is a 35 y.o. female  Who presents to the emergency department today because of shortness of breath and chest pain. The patient states that she has been having problems with a cough for the past 2 weeks. She states for the past 3 days she has had some episodes in the morning where she has been having some left sided chest pain. She describes it as a squeezing type pain. She states that it has never happened in the past. She denies any recent travel. Denies any fevers.     Past Medical History  Diagnosis Date  . Hypothyroidism   . Anxiety   . Depression   . Bipolar disorder (HCC)   . PTSD (post-traumatic stress disorder)   . Suicide attempt (HCC)     once  . Gallstone     Patient Active Problem List   Diagnosis Date Noted  . Hypothyroidism 05/29/2013  . Transaminitis 05/29/2013  . Cholestatic jaundice 05/29/2013  . Gallstones 05/29/2013  . Nausea and vomiting 05/29/2013  . Abdominal pain, epigastric 05/29/2013  . PTSD (post-traumatic stress disorder) 02/08/2013  . Social anxiety disorder 02/08/2013  . Bipolar affective disorder, depressed (HCC) 01/05/2012  . Psychoactive substance use disorder (HCC) 01/05/2012    Past Surgical History  Procedure Laterality Date  . Breast reduction surgery  2001  . Knee surgery Left     multiple.  meniscus repair, cyst removal  . Hip surgery Right 1999    slipped epiphysis  . Laparoscopic cholecystectomy w/ cholangiography N/A 05/31/2013  . Cholecystectomy N/A 05/31/2013    Procedure: LAPAROSCOPIC CHOLECYSTECTOMY WITH INTRAOPERATIVE CHOLANGIOGRAM WITH COMMON BILE DUCT EXPLORATION;  Surgeon: Dalia Heading, MD;  Location: [facility];   Service: General;  Laterality: N/A;  . Ercp N/A 06/01/2013    Procedure: ENDOSCOPIC RETROGRADE CHOLANGIOPANCREATOGRAPHY (ERCP);  Surgeon: Corbin Ade, MD;  Location: [facility];  Service: Endoscopy;  Laterality: N/A;  . Sphincterotomy N/A 06/01/2013    Procedure: SPHINCTEROTOMY;  Surgeon: Corbin Ade, MD;  Location: [facility];  Service: Endoscopy;  Laterality: N/A;  . Removal of stones N/A 06/01/2013    Procedure: REMOVAL OF STONES;  Surgeon: Corbin Ade, MD;  Location: [facility];  Service: Endoscopy;  Laterality: N/A;    Current Outpatient Rx  Name  Route  Sig  Dispense  Refill  . citalopram (CELEXA) 20 MG tablet   Oral   Take 1 tablet (20 mg total) by mouth every morning.   30 tablet   2   . famotidine (PEPCID) 20 MG tablet   Oral   Take 1 tablet (20 mg total) by mouth 2 (two) times daily.   10 tablet   0   . levothyroxine (SYNTHROID, LEVOTHROID) 25 MCG tablet   Oral   Take 37.5 mcg by mouth daily before breakfast.         . predniSONE (DELTASONE) 10 MG tablet   Oral   Take 2 tablets (20 mg total) by mouth daily.   15 tablet   0     Allergies Ativan  Family History  Problem Relation Age of Onset  . Anesthesia problems Neg Hx   . Hypotension Neg Hx   . Malignant hyperthermia Neg Hx   .  Pseudochol deficiency Neg Hx   . Dementia Neg Hx   . Depression Neg Hx   . Schizophrenia Neg Hx   . Seizures Neg Hx   . Sexual abuse Mother     By her father  . Alcohol abuse Mother   . Anxiety disorder Mother   . Bipolar disorder Mother   . OCD Mother   . Physical abuse Mother   . ADD / ADHD Sister   . Drug abuse Sister   . Bipolar disorder Sister   . Anxiety disorder Sister   . Paranoid behavior Sister   . ADD / ADHD Sister   . OCD Maternal Aunt   . Cancer Maternal Grandfather   . Mental illness Maternal Grandfather   . ADD / ADHD Other   . Brain cancer Other   . Colon cancer Neg Hx     Social History Social History  Substance Use Topics  . Smoking status: Former  Smoker -- 2.00 packs/day for 15 years    Types: Cigarettes    Quit date: 05/01/2012  . Smokeless tobacco: Never Used  . Alcohol Use: Yes     Comment: Used drink frequently 1 year ago. now once/month    Review of Systems  Constitutional: Negative for fever. Cardiovascular: Positive for chest pain. Respiratory: Positive for shortness of breath. Gastrointestinal: Negative for abdominal pain, vomiting and diarrhea. Neurological: Negative for headaches, focal weakness or numbness.  10-point ROS otherwise negative.  ____________________________________________   PHYSICAL EXAM:  VITAL SIGNS: ED Triage Vitals  Enc Vitals Group     BP 10/10/15 1757 140/84 mmHg     Pulse Rate 10/10/15 1757 108     Resp 10/10/15 1757 16     Temp 10/10/15 1757 97.8 F (36.6 C)     Temp Source 10/10/15 1757 Oral     SpO2 10/10/15 1757 100 %     Weight 10/10/15 1757 180 lb (81.647 kg)     Height 10/10/15 1757  (1.803 m)     Head Cir --      Peak Flow --      Pain Score 10/10/15 1758 6   Constitutional: Alert and oriented. Well appearing and in no distress. Eyes: Conjunctivae are normal. PERRL. Normal extraocular movements. ENT   Head: Normocephalic and atraumatic.   Nose: No congestion/rhinnorhea.   Mouth/Throat: Mucous membranes are moist.   Neck: No stridor. Hematological/Lymphatic/Immunilogical: No cervical lymphadenopathy. Cardiovascular: Normal rate, regular rhythm.  No murmurs, rubs, or gallops. Respiratory: Normal respiratory effort without tachypnea nor retractions. Breath sounds are clear and equal bilaterally. No wheezes/rales/rhonchi. Gastrointestinal: Soft and nontender. No distention.  Genitourinary: Deferred Musculoskeletal: Normal range of motion in all extremities. No joint effusions.  No lower extremity tenderness nor edema. Neurologic:  Normal speech and language. No gross focal neurologic deficits are appreciated.  Skin:  Skin is warm, dry and intact. No  rash noted. Psychiatric: Mood and affect are normal. Speech and behavior are normal. Patient exhibits appropriate insight and judgment.  ____________________________________________    LABS (pertinent positives/negatives)  Labs Reviewed  CBC - Abnormal; Notable for the following:    WBC 12.2 (*)    All other components within normal limits  BASIC METABOLIC PANEL  TROPONIN I  FIBRIN DERIVATIVES D-DIMER ([facility] ONLY)  POC URINE PREG, ED  POCT PREGNANCY, URINE     ____________________________________________   EKG  I, Phineas Semen, attending physician, personally viewed and interpreted this EKG  EKG Time: 1756 Rate: 101 Rhythm: sinus tachycardia Axis: right  axis deviation Intervals: qtc 464 QRS: narrow ST changes: no st elevation Impression: abnormal ekg  ____________________________________________    RADIOLOGY  CXR  IMPRESSION: No active cardiopulmonary disease.  ____________________________________________   PROCEDURES  Procedure(s) performed: None  Critical Care performed: No  ____________________________________________   INITIAL IMPRESSION / ASSESSMENT AND PLAN / ED COURSE  Pertinent labs & imaging results that were available during my care of the patient were reviewed by me and considered in my medical decision making (see chart for details).  Patient presented to the emergency department today because of concerns for chest pain and shortness breath. Workup here unremarkable including negative d-dimer. Think likely patient suffering from bronchitis. Will give cough medicine.  ____________________________________________   FINAL CLINICAL IMPRESSION(S) / ED DIAGNOSES  Final diagnoses:  Other chest pain  Bronchitis     Phineas Semen, MD 10/10/15 2143

## 2015-10-10 NOTE — ED Notes (Signed)
Reviewed d/c info, follow-up care, and prescriptions with pt. Pt verbalized understanding.

## 2015-10-10 NOTE — ED Notes (Signed)
Pt in via triage w/ complaints of intermittent chest tightness w/ shortness of breath x 1 week.  Pt reports having a cough since December.  Pt A/Ox4, vitals WDL, no immediate distress at this time.

## 2015-10-16 ENCOUNTER — Encounter: Payer: Self-pay | Admitting: *Deleted

## 2015-10-16 ENCOUNTER — Emergency Department
Admission: EM | Admit: 2015-10-16 | Discharge: 2015-10-16 | Disposition: A | Discharge status: Home / Self Care | Payer: Self-pay | Attending: Emergency Medicine | Admitting: Emergency Medicine

## 2015-10-16 DIAGNOSIS — E860 Dehydration: Secondary | ICD-10-CM | POA: Insufficient documentation

## 2015-10-16 DIAGNOSIS — R0981 Nasal congestion: Secondary | ICD-10-CM | POA: Insufficient documentation

## 2015-10-16 DIAGNOSIS — R05 Cough: Secondary | ICD-10-CM | POA: Insufficient documentation

## 2015-10-16 DIAGNOSIS — Z87891 Personal history of nicotine dependence: Secondary | ICD-10-CM | POA: Insufficient documentation

## 2015-10-16 DIAGNOSIS — M791 Myalgia: Secondary | ICD-10-CM | POA: Insufficient documentation

## 2015-10-16 DIAGNOSIS — Z3202 Encounter for pregnancy test, result negative: Secondary | ICD-10-CM | POA: Insufficient documentation

## 2015-10-16 DIAGNOSIS — R6889 Other general symptoms and signs: Secondary | ICD-10-CM

## 2015-10-16 DIAGNOSIS — J3489 Other specified disorders of nose and nasal sinuses: Secondary | ICD-10-CM | POA: Insufficient documentation

## 2015-10-16 DIAGNOSIS — Z79899 Other long term (current) drug therapy: Secondary | ICD-10-CM | POA: Insufficient documentation

## 2015-10-16 DIAGNOSIS — Z7952 Long term (current) use of systemic steroids: Secondary | ICD-10-CM | POA: Insufficient documentation

## 2015-10-16 LAB — COMPREHENSIVE METABOLIC PANEL
ALT: 15 U/L (ref 14–54)
AST: 18 U/L (ref 15–41)
Albumin: 4.3 g/dL (ref 3.5–5.0)
Alkaline Phosphatase: 47 U/L (ref 38–126)
Anion gap: 8 (ref 5–15)
BILIRUBIN TOTAL: 1.1 mg/dL (ref 0.3–1.2)
BUN: 9 mg/dL (ref 6–20)
CHLORIDE: 109 mmol/L (ref 101–111)
CO2: 23 mmol/L (ref 22–32)
CREATININE: 0.54 mg/dL (ref 0.44–1.00)
Calcium: 9.1 mg/dL (ref 8.9–10.3)
GFR calc Af Amer: >60 mL/min (ref >60)
Glucose, Bld: 124 mg/dL — ABNORMAL HIGH (ref 65–99)
Potassium: 3.9 mmol/L (ref 3.5–5.1)
Sodium: 140 mmol/L (ref 135–145)
TOTAL PROTEIN: 7.2 g/dL (ref 6.5–8.1)

## 2015-10-16 LAB — CBC
HCT: 47.5 % — ABNORMAL HIGH (ref 35.0–47.0)
Hemoglobin: 15.9 g/dL (ref 12.0–16.0)
MCH: 31.8 pg (ref 26.0–34.0)
MCHC: 33.4 g/dL (ref 32.0–36.0)
MCV: 95 fL (ref 80.0–100.0)
PLATELETS: 216 10*3/uL (ref 150–440)
RBC: 5.01 MIL/uL (ref 3.80–5.20)
RDW: 13.3 % (ref 11.5–14.5)
WBC: 6.3 10*3/uL (ref 3.6–11.0)

## 2015-10-16 LAB — URINALYSIS COMPLETE WITH MICROSCOPIC (ARMC ONLY)
BILIRUBIN URINE: NEGATIVE
GLUCOSE, UA: NEGATIVE mg/dL
HGB URINE DIPSTICK: NEGATIVE
LEUKOCYTES UA: NEGATIVE
Nitrite: NEGATIVE
Protein, ur: 30 mg/dL — AB
Specific Gravity, Urine: 1.027 (ref 1.005–1.030)
pH: 7 (ref 5.0–8.0)

## 2015-10-16 LAB — POCT PREGNANCY, URINE: Preg Test, Ur: NEGATIVE

## 2015-10-16 LAB — LIPASE, BLOOD: Lipase: 19 U/L (ref 11–51)

## 2015-10-16 MED ORDER — ONDANSETRON 4 MG PO TBDP
4.0000 mg | ORAL_TABLET | Freq: Once | ORAL | Status: AC | PRN
  Administered 2015-10-16: 4 mg via ORAL
  Filled 2015-10-16: qty 1

## 2015-10-16 MED ORDER — IBUPROFEN 800 MG PO TABS
800.0000 mg | ORAL_TABLET | Freq: Once | ORAL | Status: AC
  Administered 2015-10-16: 800 mg via ORAL
  Filled 2015-10-16: qty 1

## 2015-10-16 MED ORDER — IBUPROFEN 800 MG PO TABS: 800.0000 mg | ORAL_TABLET | Freq: Three times a day (TID) | ORAL | Status: DC | PRN

## 2015-10-16 MED ORDER — ONDANSETRON 4 MG PO TBDP: 4.0000 mg | ORAL_TABLET | Freq: Three times a day (TID) | ORAL | Status: DC | PRN

## 2015-10-16 NOTE — Discharge Instructions (Signed)
Please drink plenty of fluid to stay well-hydrated. You may take a clear liquid diet for the next 24 hours, then advance to bland BRAT diet as tolerated. Please practice frequent and good handwashing. You may take Motrin and Tylenol for your body aches and any fever or you develop, and Zofran for your nausea.  Please return to the emergency department if you develop inability to keep down fluids, severe pain, lightheadedness or fainting, fever, or any other symptoms concerning to you.

## 2015-10-16 NOTE — ED Notes (Signed)
Pt presents with chest pain today from coughing and congestion. Pt was seen her three days ago and dx with bronchitis, was sent home with tesslon perles for cough. Pt is back today stating that she does not feel any better and feels more congested in her chest.

## 2015-10-16 NOTE — ED Notes (Signed)
Pt states she was seen here last week and diagnoised with bronchitits, states yesterday she began vomiting, states she feels dehydrated, states she has not been able to eat or drink

## 2015-10-16 NOTE — ED Provider Notes (Signed)
[facility] Emergency Department Provider Note  ____________________________________________  Time seen: Approximately 11:30 AM  I have reviewed the triage vital signs and the nursing notes.   HISTORY  Chief Complaint Emesis    HPI [name] [name] is a 35 y.o. female presenting with nausea and vomiting in the setting of a flulike illness. Patient reports that 3 days ago she was seen for nonproductive cough and diagnosed with bronchitis, and her symptoms have improved with Mucinex and Tessalon Perles. She is also been having congestion and some mild sore throat. Yesterday she began to have diffuse myalgias as well as multiple episodes of nausea and vomiting without abdominal pain or diarrhea. Today she has not been having any vomiting but has not tried to take any by mouth. Fever yesterday but did not take her temperature. No chest pain or shortness of breath or lightheadedness or syncope.   Past Medical History  Diagnosis Date  . Hypothyroidism   . Anxiety   . Depression   . Bipolar disorder (HCC)   . PTSD (post-traumatic stress disorder)   . Suicide attempt (HCC)     once  . Gallstone     Patient Active Problem List   Diagnosis Date Noted  . Hypothyroidism 05/29/2013  . Transaminitis 05/29/2013  . Cholestatic jaundice 05/29/2013  . Gallstones 05/29/2013  . Nausea and vomiting 05/29/2013  . Abdominal pain, epigastric 05/29/2013  . PTSD (post-traumatic stress disorder) 02/08/2013  . Social anxiety disorder 02/08/2013  . Bipolar affective disorder, depressed (HCC) 01/05/2012  . Psychoactive substance use disorder (HCC) 01/05/2012    Past Surgical History  Procedure Laterality Date  . Breast reduction surgery  2001  . Knee surgery Left     multiple.  meniscus repair, cyst removal  . Hip surgery Right 1999    slipped epiphysis  . Laparoscopic cholecystectomy w/ cholangiography N/A 05/31/2013  . Cholecystectomy N/A 05/31/2013    Procedure:  LAPAROSCOPIC CHOLECYSTECTOMY WITH INTRAOPERATIVE CHOLANGIOGRAM WITH COMMON BILE DUCT EXPLORATION;  Surgeon: Dalia Heading, MD;  Location: [facility];  Service: General;  Laterality: N/A;  . Ercp N/A 06/01/2013    Procedure: ENDOSCOPIC RETROGRADE CHOLANGIOPANCREATOGRAPHY (ERCP);  Surgeon: Corbin Ade, MD;  Location: [facility];  Service: Endoscopy;  Laterality: N/A;  . Sphincterotomy N/A 06/01/2013    Procedure: SPHINCTEROTOMY;  Surgeon: Corbin Ade, MD;  Location: [facility];  Service: Endoscopy;  Laterality: N/A;  . Removal of stones N/A 06/01/2013    Procedure: REMOVAL OF STONES;  Surgeon: Corbin Ade, MD;  Location: [facility];  Service: Endoscopy;  Laterality: N/A;    Current Outpatient Rx  Name  Route  Sig  Dispense  Refill  . benzonatate (TESSALON PERLES) 100 MG capsule   Oral   Take 1 capsule (100 mg total) by mouth every 6 (six) hours as needed for cough.   30 capsule   0   . citalopram (CELEXA) 20 MG tablet   Oral   Take 1 tablet (20 mg total) by mouth every morning.   30 tablet   2   . guaiFENesin (MUCINEX) 600 MG 12 hr tablet   Oral   Take 600 mg by mouth 2 (two) times daily.         Marland Kitchen levothyroxine (SYNTHROID, LEVOTHROID) 25 MCG tablet   Oral   Take 37.5 mcg by mouth daily before breakfast.         . famotidine (PEPCID) 20 MG tablet   Oral   Take 1 tablet (20 mg  total) by mouth 2 (two) times daily.   10 tablet   0   . ibuprofen (ADVIL,MOTRIN) 800 MG tablet   Oral   Take 1 tablet (800 mg total) by mouth every 8 (eight) hours as needed.   20 tablet   0   . ondansetron (ZOFRAN-ODT) 4 MG disintegrating tablet   Oral   Take 1 tablet (4 mg total) by mouth every 8 (eight) hours as needed for nausea.   20 tablet   0   . predniSONE (DELTASONE) 10 MG tablet   Oral   Take 2 tablets (20 mg total) by mouth daily.   15 tablet   0     Allergies Ativan  Family History  Problem Relation Age of Onset  . Anesthesia problems Neg Hx   . Hypotension Neg Hx   .  Malignant hyperthermia Neg Hx   . Pseudochol deficiency Neg Hx   . Dementia Neg Hx   . Depression Neg Hx   . Schizophrenia Neg Hx   . Seizures Neg Hx   . Sexual abuse Mother     By her father  . Alcohol abuse Mother   . Anxiety disorder Mother   . Bipolar disorder Mother   . OCD Mother   . Physical abuse Mother   . ADD / ADHD Sister   . Drug abuse Sister   . Bipolar disorder Sister   . Anxiety disorder Sister   . Paranoid behavior Sister   . ADD / ADHD Sister   . OCD Maternal Aunt   . Cancer Maternal Grandfather   . Mental illness Maternal Grandfather   . ADD / ADHD Other   . Brain cancer Other   . Colon cancer Neg Hx     Social History Social History  Substance Use Topics  . Smoking status: Former Smoker -- 2.00 packs/day for 15 years    Types: Cigarettes    Quit date: 05/01/2012  . Smokeless tobacco: Never Used  . Alcohol Use: Yes     Comment: Used drink frequently 1 year ago. now once/month    Review of Systems Constitutional: No fever/chills. No lightheadedness or syncope. Positive diffuse myalgias Eyes: No visual changes. No eye discharge. ENT: No sore throat. Positive congestion and rhinorrhea. Cardiovascular: Denies chest pain, palpitations. Respiratory: Denies shortness of breath.  Positive cough. Gastrointestinal: No abdominal pain.  Positive nausea, positive vomiting.  No diarrhea.  No constipation. Genitourinary: Negative for dysuria. Musculoskeletal: Negative for back pain. Skin: Negative for rash. Neurological: Negative for headaches, focal weakness or numbness.  10-point ROS otherwise negative.  ____________________________________________   PHYSICAL EXAM:  VITAL SIGNS: ED Triage Vitals  Enc Vitals Group     BP 10/16/15 0839 117/78 mmHg     Pulse Rate 10/16/15 0839 78     Resp 10/16/15 0839 18     Temp 10/16/15 0839 98.4 F (36.9 C)     Temp Source 10/16/15 0839 Oral     SpO2 10/16/15 0839 96 %     Weight 10/16/15 0839 185 lb (83.915  kg)     Height 10/16/15 0839 5\' 10"  (1.778 m)     Head Cir --      Peak Flow --      Pain Score 10/16/15 0840 9     Pain Loc --      Pain Edu? --      Excl. in GC? --     Constitutional: Alert and oriented. Well appearing and in no acute distress.  Answer question appropriately. Eyes: Conjunctivae are normal.  EOMI. no scleral icterus. No eye discharge. Head: Atraumatic. Nose: Positive congestion without rhinorrhea. Mouth/Throat: Mucous membranes are dry. No posterior pharyngeal erythema, tonsillar swelling or exudate. Neck: No stridor.  Supple.  No meningismus. Cardiovascular: Normal rate, regular rhythm. No murmurs, rubs or gallops.  Respiratory: Normal respiratory effort.  No retractions. Lungs CTAB.  No wheezes, rales or ronchi. Gastrointestinal: Soft and nontender. No distention. No peritoneal signs. Musculoskeletal: No LE edema.  Neurologic:  Normal speech and language. No gross focal neurologic deficits are appreciated.  Skin:  Skin is warm, dry and intact. No rash noted. Psychiatric: Mood and affect are normal. Speech and behavior are normal.  Normal judgement.  ____________________________________________   LABS (all labs ordered are listed, but only abnormal results are displayed)  Labs Reviewed  COMPREHENSIVE METABOLIC PANEL - Abnormal; Notable for the following:    Glucose, Bld 124 (*)    All other components within normal limits  CBC - Abnormal; Notable for the following:    HCT 47.5 (*)    All other components within normal limits  URINALYSIS COMPLETEWITH MICROSCOPIC ([facility] ONLY) - Abnormal; Notable for the following:    Color, Urine YELLOW (*)    APPearance HAZY (*)    Ketones, ur 1+ (*)    Protein, ur 30 (*)    Bacteria, UA RARE (*)    Squamous Epithelial / LPF 6-30 (*)    All other components within normal limits  LIPASE, BLOOD  POC URINE PREG, ED  POCT PREGNANCY, URINE   ____________________________________________  EKG  Not  indicated ____________________________________________  RADIOLOGY  No results found.  ____________________________________________   PROCEDURES  Procedure(s) performed: None  Critical Care performed: No ____________________________________________   INITIAL IMPRESSION / ASSESSMENT AND PLAN / ED COURSE  Pertinent labs & imaging results that were available during my care of the patient were reviewed by me and considered in my medical decision making (see chart for details).  35 y.o. female with fever, nonproductive cough, nausea and vomiting, and diffuse myalgias and has not had her flu shot this year. Overall, the patient looks dry but is nontoxic in appearance. The most likely etiology of her symptoms is influenza or flulike illness. I do not see any evidence of acute pneumonia on my exam and her chest x-ray does not show pneumonia. Her labs are otherwise reassuring. I will treat her symptomatically with oral hydration as she defers IV fluids at this time. We will also give her Motrin for myalgias. I anticipate discharge home with close PMD follow-up. Transient impression as well as discharge instruction's.  ____________________________________________  FINAL CLINICAL IMPRESSION(S) / ED DIAGNOSES  Final diagnoses:  Flu-like symptoms  Dehydration      NEW MEDICATIONS STARTED DURING THIS VISIT:  Discharge Medication List as of 10/16/2015 11:36 AM    START taking these medications   Details  ibuprofen (ADVIL,MOTRIN) 800 MG tablet Take 1 tablet (800 mg total) by mouth every 8 (eight) hours as needed., Starting 10/16/2015, Until Discontinued, Print    ondansetron (ZOFRAN-ODT) 4 MG disintegrating tablet Take 1 tablet (4 mg total) by mouth every 8 (eight) hours as needed for nausea., Starting 10/16/2015, Until Discontinued, Print         Rockne Menghini, MD 10/16/15 1535

## 2015-12-20 ENCOUNTER — Emergency Department
Admission: EM | Admit: 2015-12-20 | Discharge: 2015-12-20 | Disposition: A | Discharge status: Home / Self Care | Payer: Self-pay | Attending: Emergency Medicine | Admitting: Emergency Medicine

## 2015-12-20 ENCOUNTER — Encounter: Payer: Self-pay | Admitting: Emergency Medicine

## 2015-12-20 DIAGNOSIS — F17210 Nicotine dependence, cigarettes, uncomplicated: Secondary | ICD-10-CM | POA: Insufficient documentation

## 2015-12-20 DIAGNOSIS — Z79899 Other long term (current) drug therapy: Secondary | ICD-10-CM | POA: Insufficient documentation

## 2015-12-20 DIAGNOSIS — M1712 Unilateral primary osteoarthritis, left knee: Secondary | ICD-10-CM | POA: Insufficient documentation

## 2015-12-20 DIAGNOSIS — E039 Hypothyroidism, unspecified: Secondary | ICD-10-CM | POA: Insufficient documentation

## 2015-12-20 DIAGNOSIS — F319 Bipolar disorder, unspecified: Secondary | ICD-10-CM | POA: Insufficient documentation

## 2015-12-20 MED ORDER — NAPROXEN 500 MG PO TABS: 500.0000 mg | ORAL_TABLET | Freq: Two times a day (BID) | ORAL | Status: DC

## 2015-12-20 MED ORDER — KETOROLAC TROMETHAMINE 30 MG/ML IJ SOLN
30.0000 mg | Freq: Once | INTRAMUSCULAR | Status: AC
  Administered 2015-12-20: 30 mg via INTRAMUSCULAR
  Filled 2015-12-20: qty 1

## 2015-12-20 NOTE — Discharge Instructions (Signed)

## 2015-12-20 NOTE — ED Notes (Signed)
Pt to ed with c/o left knee pain.  Reports hx of surgeries on same.

## 2015-12-20 NOTE — ED Provider Notes (Signed)
[facility] Emergency Department Provider Note  ____________________________________________  Time seen: Approximately 11:55 AM  I have reviewed the triage vital signs and the nursing notes.   HISTORY  Chief Complaint Knee Pain    HPI [name] [name] is a 35 y.o. female , NAD, presents to the emergency department with complaint of left knee pain. States she has had chronic knee pain over the course of the last 15 years. States that it was evaluated approximately 10 years ago and she was told her knee "looked like the knee of a 35 year old". Has had pain off and on since that time in which she normally is able to manage. 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 Notes that over the last 24 hours she has had increasing pain in the left knee that is radiated up into the left hip. Denies any recent falls, injuries, traumas. Has had no numbness, tingling. Denies any back or hip pain. Has not noted any redness, swelling, warmth to the knee.   Past Medical History  Diagnosis Date  . Hypothyroidism   . Anxiety   . Depression   . Bipolar disorder (HCC)   . PTSD (post-traumatic stress disorder)   . Suicide attempt (HCC)     once  . Gallstone     Patient Active Problem List   Diagnosis Date Noted  . Hypothyroidism 05/29/2013  . Transaminitis 05/29/2013  . Cholestatic jaundice 05/29/2013  . Gallstones 05/29/2013  . Nausea and vomiting 05/29/2013  . Abdominal pain, epigastric 05/29/2013  . PTSD (post-traumatic stress disorder) 02/08/2013  . Social anxiety disorder 02/08/2013  . Bipolar affective disorder, depressed (HCC) 01/05/2012  . Psychoactive substance use disorder (HCC) 01/05/2012    Past Surgical History  Procedure Laterality Date  . Breast reduction surgery  2001  . Knee surgery Left     multiple.  meniscus repair, cyst removal  . Hip surgery Right 1999    slipped epiphysis  . Laparoscopic cholecystectomy w/ cholangiography N/A  05/31/2013  . Cholecystectomy N/A 05/31/2013    Procedure: LAPAROSCOPIC CHOLECYSTECTOMY WITH INTRAOPERATIVE CHOLANGIOGRAM WITH COMMON BILE DUCT EXPLORATION;  Surgeon: Dalia Heading, MD;  Location: [facility];  Service: General;  Laterality: N/A;  . Ercp N/A 06/01/2013    Procedure: ENDOSCOPIC RETROGRADE CHOLANGIOPANCREATOGRAPHY (ERCP);  Surgeon: Corbin Ade, MD;  Location: [facility];  Service: Endoscopy;  Laterality: N/A;  . Sphincterotomy N/A 06/01/2013    Procedure: SPHINCTEROTOMY;  Surgeon: Corbin Ade, MD;  Location: [facility];  Service: Endoscopy;  Laterality: N/A;  . Removal of stones N/A 06/01/2013    Procedure: REMOVAL OF STONES;  Surgeon: Corbin Ade, MD;  Location: [facility];  Service: Endoscopy;  Laterality: N/A;    Current Outpatient Rx  Name  Route  Sig  Dispense  Refill  . benzonatate (TESSALON PERLES) 100 MG capsule   Oral   Take 1 capsule (100 mg total) by mouth every 6 (six) hours as needed for cough.   30 capsule   0   . citalopram (CELEXA) 20 MG tablet   Oral   Take 1 tablet (20 mg total) by mouth every morning.   30 tablet   2   . famotidine (PEPCID) 20 MG tablet   Oral   Take 1 tablet (20 mg total) by mouth 2 (two) times daily.   10 tablet   0   . guaiFENesin (MUCINEX) 600 MG 12 hr tablet   Oral   Take 600 mg by  mouth 2 (two) times daily.         Marland Kitchen. ibuprofen (ADVIL,MOTRIN) 800 MG tablet   Oral   Take 1 tablet (800 mg total) by mouth every 8 (eight) hours as needed.   20 tablet   0   . levothyroxine (SYNTHROID, LEVOTHROID) 25 MCG tablet   Oral   Take 37.5 mcg by mouth daily before breakfast.         . naproxen (NAPROSYN) 500 MG tablet   Oral   Take 1 tablet (500 mg total) by mouth 2 (two) times daily with a meal.   30 tablet   0   . ondansetron (ZOFRAN-ODT) 4 MG disintegrating tablet   Oral   Take 1 tablet (4 mg total) by mouth every 8 (eight) hours as needed for nausea.   20 tablet   0   . predniSONE (DELTASONE) 10 MG tablet   Oral   Take 2  tablets (20 mg total) by mouth daily.   15 tablet   0     Allergies Ativan  Family History  Problem Relation Age of Onset  . Anesthesia problems Neg Hx   . Hypotension Neg Hx   . Malignant hyperthermia Neg Hx   . Pseudochol deficiency Neg Hx   . Dementia Neg Hx   . Depression Neg Hx   . Schizophrenia Neg Hx   . Seizures Neg Hx   . Sexual abuse Mother     By her father  . Alcohol abuse Mother   . Anxiety disorder Mother   . Bipolar disorder Mother   . OCD Mother   . Physical abuse Mother   . ADD / ADHD Sister   . Drug abuse Sister   . Bipolar disorder Sister   . Anxiety disorder Sister   . Paranoid behavior Sister   . ADD / ADHD Sister   . OCD Maternal Aunt   . Cancer Maternal Grandfather   . Mental illness Maternal Grandfather   . ADD / ADHD Other   . Brain cancer Other   . Colon cancer Neg Hx     Social History Social History  Substance Use Topics  . Smoking status: Former Smoker -- 2.00 packs/day for 15 years    Types: Cigarettes    Quit date: 05/01/2012  . Smokeless tobacco: Never Used  . Alcohol Use: Yes     Comment: Used drink frequently 1 year ago. now once/month     Review of Systems  Constitutional: No fever/chills, fatigue Cardiovascular: No chest pain. Respiratory: No shortness of breath. No wheezing.  Gastrointestinal: No abdominal pain.  No nausea, vomiting.   Musculoskeletal: Positive left knee pain. Negative for back, hip pain.  Skin: Negative for rash. Neurological: Negative for headaches, focal weakness or numbness. 10-point ROS otherwise negative.  ____________________________________________   PHYSICAL EXAM:  VITAL SIGNS: ED Triage Vitals  Enc Vitals Group     BP 12/20/15 1050 120/73 mmHg     Pulse Rate 12/20/15 1050 85     Resp 12/20/15 1050 18     Temp 12/20/15 1050 98.9 F (37.2 C)     Temp Source 12/20/15 1050 Oral     SpO2 12/20/15 1050 98 %     Weight 12/20/15 1050 190 lb (86.183 kg)     Height 12/20/15 1050 5\' 11"   (1.803 m)     Head Cir --      Peak Flow --      Pain Score 12/20/15 1048 8  Pain Loc --      Pain Edu? --      Excl. in GC? --      Constitutional: Alert and oriented. Well appearing and in no acute distress. Eyes: Conjunctivae are normal.  Head: Atraumatic. Cardiovascular: Good peripheral circulation with 2+ pulses noted in the left lower extremity. Respiratory: Normal respiratory effort without tachypnea or retractions.  Musculoskeletal: Full range of motion of the left knee without pain. Mild crepitus noted with full extension of the left knee. Negative patellofemoral grinding test. No laxity with anterior posterior drawer the left knee. No laxity with varus or valgus stress of the left knee. No lower extremity tenderness nor edema.  No joint effusions. Neurologic:  Normal speech and language. No gross focal neurologic deficits are appreciated.  Skin:  Skin is warm, dry and intact. No rash noted. Psychiatric: Mood and affect are normal. Speech and behavior are normal. Patient exhibits appropriate insight and judgement.   ____________________________________________   LABS  None ____________________________________________  EKG  None ____________________________________________  RADIOLOGY  None ____________________________________________    PROCEDURES  Procedure(s) performed: None      Medications  ketorolac (TORADOL) 30 MG/ML injection 30 mg (not administered)     ____________________________________________   INITIAL IMPRESSION / ASSESSMENT AND PLAN / ED COURSE  Patient's diagnosis is consistent with arthritis of left knee. Patient will be discharged home with prescriptions for naproxen to take as directed. Patient should get an elastic knee support brace from a local pharmacy to use as needed. Patient is to follow up with [facility] or [facility] if symptoms persist past this treatment course. Patient is given ED  precautions to return to the ED for any worsening or new symptoms.      ____________________________________________  FINAL CLINICAL IMPRESSION(S) / ED DIAGNOSES  Final diagnoses:  Arthritis of left knee      NEW MEDICATIONS STARTED DURING THIS VISIT:  New Prescriptions   NAPROXEN (NAPROSYN) 500 MG TABLET    Take 1 tablet (500 mg total) by mouth 2 (two) times daily with a meal.         Hope Pigeon, PA-C 12/20/15 1207  Governor Rooks, MD 12/20/15 1320

## 2016-03-05 DIAGNOSIS — B86 Scabies: Secondary | ICD-10-CM | POA: Insufficient documentation

## 2016-03-05 DIAGNOSIS — E039 Hypothyroidism, unspecified: Secondary | ICD-10-CM | POA: Insufficient documentation

## 2016-03-05 DIAGNOSIS — Z87891 Personal history of nicotine dependence: Secondary | ICD-10-CM | POA: Insufficient documentation

## 2016-03-05 DIAGNOSIS — A6920 Lyme disease, unspecified: Secondary | ICD-10-CM | POA: Insufficient documentation

## 2016-03-05 DIAGNOSIS — F329 Major depressive disorder, single episode, unspecified: Secondary | ICD-10-CM | POA: Insufficient documentation

## 2016-03-05 DIAGNOSIS — M1990 Unspecified osteoarthritis, unspecified site: Secondary | ICD-10-CM | POA: Insufficient documentation

## 2016-03-06 ENCOUNTER — Emergency Department
Admission: EM | Admit: 2016-03-06 | Discharge: 2016-03-06 | Disposition: A | Discharge status: Home / Self Care | Payer: Self-pay | Attending: Emergency Medicine | Admitting: Emergency Medicine

## 2016-03-06 ENCOUNTER — Encounter: Payer: Self-pay | Admitting: Emergency Medicine

## 2016-03-06 ENCOUNTER — Emergency Department: Payer: Self-pay

## 2016-03-06 DIAGNOSIS — A6920 Lyme disease, unspecified: Secondary | ICD-10-CM

## 2016-03-06 DIAGNOSIS — B86 Scabies: Secondary | ICD-10-CM

## 2016-03-06 HISTORY — DX: Unspecified osteoarthritis, unspecified site: M19.90

## 2016-03-06 LAB — CBC
HCT: 44.6 % (ref 35.0–47.0)
Hemoglobin: 15.4 g/dL (ref 12.0–16.0)
MCH: 32.3 pg (ref 26.0–34.0)
MCHC: 34.5 g/dL (ref 32.0–36.0)
MCV: 93.5 fL (ref 80.0–100.0)
Platelets: 221 10*3/uL (ref 150–440)
RBC: 4.78 MIL/uL (ref 3.80–5.20)
RDW: 13 % (ref 11.5–14.5)
WBC: 11.8 10*3/uL — ABNORMAL HIGH (ref 3.6–11.0)

## 2016-03-06 LAB — BASIC METABOLIC PANEL
ANION GAP: 9 (ref 5–15)
BUN: 11 mg/dL (ref 6–20)
CALCIUM: 9.3 mg/dL (ref 8.9–10.3)
CO2: 24 mmol/L (ref 22–32)
Chloride: 106 mmol/L (ref 101–111)
Creatinine, Ser: 0.59 mg/dL (ref 0.44–1.00)
GFR calc Af Amer: >60 mL/min (ref >60)
GLUCOSE: 98 mg/dL (ref 65–99)
POTASSIUM: 3.4 mmol/L — AB (ref 3.5–5.1)
SODIUM: 139 mmol/L (ref 135–145)

## 2016-03-06 LAB — TROPONIN I: Troponin I: <0.03 ng/mL

## 2016-03-06 MED ORDER — PERMETHRIN 5 % EX CREA
TOPICAL_CREAM | Freq: Once | CUTANEOUS | Status: AC
  Administered 2016-03-06: 1 via TOPICAL
  Filled 2016-03-06: qty 60

## 2016-03-06 MED ORDER — DOXYCYCLINE HYCLATE 100 MG PO TABS: 100.0000 mg | ORAL_TABLET | Freq: Two times a day (BID) | ORAL | Status: AC

## 2016-03-06 MED ORDER — DOXYCYCLINE HYCLATE 100 MG PO TABS
100.0000 mg | ORAL_TABLET | Freq: Once | ORAL | Status: AC
  Administered 2016-03-06: 100 mg via ORAL
  Filled 2016-03-06: qty 1

## 2016-03-06 NOTE — Discharge Instructions (Signed)
Lyme Disease Lyme disease is an infection that affects many parts of the body, including the skin, joints, and nervous system. CAUSES Lyme disease is caused by bacteria called Borrelia burgdorferi. You can get Lyme disease by being bitten by an infected tick. The tick must be attached to your skin for at least 36 hours to transmit the infection. Deer often carry infected ticks. RISK FACTORS  Living in or visiting [place], the [address], or the [address]  Spending time in wooded or grassy areas.  Being outdoors with exposed skin.  Failing to remove a tick from your skin within 3-4 days. SIGNS AND SYMPTOMS  A round, red rash that comes out from the center of the tick bite. This is the first sign of infection. The center of the rash may be blood colored or have tiny blisters.  Fatigue.  Headache.  Chills and fever.  General achiness.  Joint pain, often in the knee.  Swollen lymph glands. DIAGNOSIS Lyme disease is diagnosed with a medical history, physical exam, and blood test. TREATMENT The main treatment is antibiotic medicine, usually taken by mouth. The length of treatment depends on how soon after a tick bite you begin taking the medicine. In some cases, treatment is necessary for several weeks. If the infection is severe, IV antibiotics may be necessary. HOME CARE INSTRUCTIONS  Take your antibiotic medicine as directed by your health care provider. Finish the antibiotic even if you start to feel better.  You may take a probiotic in between doses of your antibiotic to help avoid stomach upset or diarrhea.  Check with your health care provider before supplementing your treatment. Many alternative therapies have not been proven and may be harmful to you.  Keep all follow-up visits as directed by your health care provider. This is important. PREVENTION Reinfection is possible with another tick bite by an infected tick. Take these precautions to prevent an  infection:  Cover your skin with light-colored clothing when outdoors in the spring and summer months.  Spray clothing and skin with bug spray. The spray should be 20-30% DEET.  Avoided wooded, grassy, and shaded areas.  Remove yard litter, brush, trash, and plants that attract deer and rodents.  Check yourself for ticks when you come indoors.  Wash clothing worn each day.  Check your pets for ticks before they come inside.  If you find a tick:  Remove it with tweezers.  Clean your hands and the bite area with rubbing alcohol or soap and water. Pregnant women should take special care to avoid tick bites because the infection can be passed along to the fetus. SEEK MEDICAL CARE IF:  You have symptoms after treatment.  You have removed a tick and want to bring it to your health care provider for testing. SEEK IMMEDIATE MEDICAL CARE IF:  You have an irregular heartbeat.  You have nerve pain.  Your face feels numb. MAKE SURE YOU:  Understand these instructions.  Will watch your condition.  Will get help right away if you are not doing well or get worse.   This information is not intended to replace advice given to you by your health care provider. Make sure you discuss any questions you have with your health care provider.   Document Released: 11/23/2000 Document Revised: 09/07/2014 Document Reviewed: 01/02/2014 Elsevier Interactive Patient Education 2016 [employer].  Scabies, Adult Scabies is a skin condition that happens when very small insects get under the skin (infestation). This causes a rash and  severe itchiness. Scabies can spread from person to person (is contagious). If you get scabies, it is common for others in your household to get scabies too. With proper treatment, symptoms usually go away in 2-4 weeks. Scabies usually does not cause lasting problems. CAUSES This condition is caused by mites (Sarcoptes scabiei, or human itch mites) that can only be seen  with a microscope. The mites get into the top layer of skin and lay eggs. Scabies can spread from person to person through:  Close contact with a person who has scabies.  Contact with infested items, such as towels, bedding, or clothing. RISK FACTORS This condition is more likely to develop in:  People who live in nursing homes and other extended-care facilities.  People who have sexual contact with a partner who has scabies.  Young children who attend child care facilities.  People who care for others who are at increased risk for scabies. SYMPTOMS Symptoms of this condition may include:  Severe itchiness. This is often worse at night.  A rash that includes tiny red bumps or blisters. The rash commonly occurs on the wrist, elbow, armpit, fingers, waist, groin, or buttocks. Bumps may form a line (burrow) in some areas.  Skin irritation. This can include scaly patches or sores. DIAGNOSIS This condition is diagnosed with a physical exam. Your health care provider will look closely at your skin. In some cases, your health care provider may take a sample of your affected skin (skin scraping) and have it examined under a microscope. TREATMENT This condition may be treated with:  Medicated cream or lotion that kills the mites. This is spread on the entire body and left on for several hours. Usually, one treatment with medicated cream or lotion is enough to kill all of the mites. In severe cases, the treatment may be repeated.  Medicated cream that relieves itching.  Medicines that help to relieve itching.  Medicines that kill the mites. This treatment is rarely used. HOME CARE INSTRUCTIONS Medicines  Take or apply over-the-counter and prescription medicines as told by your health care provider.  Apply medicated cream or lotion as told by your health care provider.  Do not wash off the medicated cream or lotion until the necessary amount of time has passed. Skin Care  Avoid  scratching your affected skin.  Keep your fingernails closely trimmed to reduce injury from scratching.  Take cool baths or apply cool washcloths to help reduce itching. General Instructions  Clean all items that you recently had contact with, including bedding, clothing, and furniture. Do this on the same day that your treatment starts.  Use hot water when you wash items.  Place unwashable items into closed, airtight plastic bags for at least 3 days. The mites cannot live for more than 3 days away from human skin.  Vacuum furniture and mattresses that you use.  Make sure that other people who may have been infested are examined by a health care provider. These include members of your household and anyone who may have had contact with infested items.  Keep all follow-up visits as told by your health care provider. This is important. SEEK MEDICAL CARE IF:  You have itching that does not go away after 4 weeks of treatment.  You continue to develop new bumps or burrows.  You have redness, swelling, or pain in your rash area after treatment.  You have fluid, blood, or pus coming from your rash.   This information is not intended to replace  advice given to you by your health care provider. Make sure you discuss any questions you have with your health care provider.   Document Released: 05/08/2015 Document Reviewed: 03/19/2015 Elsevier Interactive Patient Education [employer]2016 Elsevier Inc.

## 2016-03-06 NOTE — ED Notes (Signed)
Patient ambulatory to triage with steady gait, pt presents c/o left sided chest pain and numbness for a week. Pt reports was bit by a tick 3 weeks ago and reports became "sick I had a headache, felt tired, throwing up." Pt reports noticed rash getting worse in the past week, states "it was a little spot and it got bigger." Pt alert and oriented x 4, no increased work in breathing noted, skin warm and dry.

## 2016-03-06 NOTE — ED Provider Notes (Signed)
[facility] Emergency Department Provider Note  ____________________________________________  Time seen: 4:00 AM  I have reviewed the triage vital signs and the nursing notes.   HISTORY  Chief Complaint Chest Pain and Rash      HPI [name] [name] is a 35 y.o. female presents with generalized pruritic rash after [place] from the beach approximately a [place] ago. Patient also admits to being hit by a tick 3 weeks ago with headache joint pain fatigue following tick removal. Patient denies any fever afebrile on presentation temperature 97.9.    Past Medical History  Diagnosis Date  . Hypothyroidism   . Anxiety   . Depression   . Bipolar disorder (HCC)   . PTSD (post-traumatic stress disorder)   . Suicide attempt (HCC)     once  . Gallstone   . Arthritis     Patient Active Problem List   Diagnosis Date Noted  . Hypothyroidism 05/29/2013  . Transaminitis 05/29/2013  . Cholestatic jaundice 05/29/2013  . Gallstones 05/29/2013  . Nausea and vomiting 05/29/2013  . Abdominal pain, epigastric 05/29/2013  . PTSD (post-traumatic stress disorder) 02/08/2013  . Social anxiety disorder 02/08/2013  . Bipolar affective disorder, depressed (HCC) 01/05/2012  . Psychoactive substance use disorder (HCC) 01/05/2012    Past Surgical History  Procedure Laterality Date  . Breast reduction surgery  2001  . Knee surgery Left     multiple.  meniscus repair, cyst removal  . Hip surgery Right 1999    slipped epiphysis  . Laparoscopic cholecystectomy w/ cholangiography N/A 05/31/2013  . Cholecystectomy N/A 05/31/2013    Procedure: LAPAROSCOPIC CHOLECYSTECTOMY WITH INTRAOPERATIVE CHOLANGIOGRAM WITH COMMON BILE DUCT EXPLORATION;  Surgeon: Dalia Heading, MD;  Location: [facility];  Service: General;  Laterality: N/A;  . Ercp N/A 06/01/2013    Procedure: ENDOSCOPIC RETROGRADE CHOLANGIOPANCREATOGRAPHY (ERCP);  Surgeon: Corbin Ade, MD;  Location: [facility];  Service:  Endoscopy;  Laterality: N/A;  . Sphincterotomy N/A 06/01/2013    Procedure: SPHINCTEROTOMY;  Surgeon: Corbin Ade, MD;  Location: [facility];  Service: Endoscopy;  Laterality: N/A;  . Removal of stones N/A 06/01/2013    Procedure: REMOVAL OF STONES;  Surgeon: Corbin Ade, MD;  Location: [facility];  Service: Endoscopy;  Laterality: N/A;    Current Outpatient Rx  Name  [name]  Sig  Dispense  Refill  . benzonatate (TESSALON PERLES) 100 MG capsule   Oral   Take 1 capsule (100 mg total) by mouth every 6 (six) hours as needed for cough.   30 capsule   0   . citalopram (CELEXA) 20 MG tablet   Oral   Take 1 tablet (20 mg total) by mouth every morning.   30 tablet   2   . famotidine (PEPCID) 20 MG tablet   Oral   Take 1 tablet (20 mg total) by mouth 2 (two) times daily.   10 tablet   0   . guaiFENesin (MUCINEX) 600 MG 12 hr tablet   Oral   Take 600 mg by mouth 2 (two) times daily.         Marland Kitchen ibuprofen (ADVIL,MOTRIN) 800 MG tablet   Oral   Take 1 tablet (800 mg total) by mouth every 8 (eight) hours as needed.   20 tablet   0   . levothyroxine (SYNTHROID, LEVOTHROID) 25 MCG tablet   Oral   Take 37.5 mcg by mouth daily before breakfast.         . naproxen (NAPROSYN) 500  MG tablet   Oral   Take 1 tablet (500 mg total) by mouth 2 (two) times daily with a meal.   30 tablet   0   . ondansetron (ZOFRAN-ODT) 4 MG disintegrating tablet   Oral   Take 1 tablet (4 mg total) by mouth every 8 (eight) hours as needed for nausea.   20 tablet   0   . predniSONE (DELTASONE) 10 MG tablet   Oral   Take 2 tablets (20 mg total) by mouth daily.   15 tablet   0     Allergies Ativan  Family History  Problem Relation Age of Onset  . Anesthesia problems Neg Hx   . Hypotension Neg Hx   . Malignant hyperthermia Neg Hx   . Pseudochol deficiency Neg Hx   . Dementia Neg Hx   . Depression Neg Hx   . Schizophrenia Neg Hx   . Seizures Neg Hx   . Sexual abuse Mother     By her father  .  Alcohol abuse Mother   . Anxiety disorder Mother   . Bipolar disorder Mother   . OCD Mother   . Physical abuse Mother   . ADD / ADHD Sister   . Drug abuse Sister   . Bipolar disorder Sister   . Anxiety disorder Sister   . Paranoid behavior Sister   . ADD / ADHD Sister   . OCD Maternal Aunt   . Cancer Maternal Grandfather   . Mental illness Maternal Grandfather   . ADD / ADHD Other   . Brain cancer Other   . Colon cancer Neg Hx     Social History Social History  Substance Use Topics  . Smoking status: Former Smoker -- 2.00 packs/day for 15 years    Types: Cigarettes    Quit date: 05/01/2012  . Smokeless tobacco: Never Used  . Alcohol Use: No     Comment: Used drink frequently 1 year ago. now once/month    Review of Systems  Constitutional: Negative for fever. Eyes: Negative for visual changes. ENT: Negative for sore throat. Cardiovascular: Negative for chest pain. Respiratory: Negative for shortness of breath. Gastrointestinal: Negative for abdominal pain, vomiting and diarrhea. Genitourinary: Negative for dysuria. Musculoskeletal: Negative for back pain. Skin: Positive for rash. Neurological: Negative for headaches, focal weakness or numbness.   10-point ROS otherwise negative.  ____________________________________________   PHYSICAL EXAM:  VITAL SIGNS: ED Triage Vitals  Enc Vitals Group     BP 03/06/16 0017 121/85 mmHg     Pulse Rate 03/06/16 0017 72     Resp 03/06/16 0017 18     Temp 03/06/16 0017 97.9 F (36.6 C)     Temp Source 03/06/16 0017 Oral     SpO2 03/06/16 0017 99 %     Weight 03/06/16 0017 190 lb (86.183 kg)     Height 03/06/16 0017 5\' 9"  (1.753 m)     Head Cir --      Peak Flow --      Pain Score 03/06/16 0027 7     Pain Loc --      Pain Edu? --      Excl. in GC? --      Constitutional: Alert and oriented. Well appearing and in no distress. Eyes: Conjunctivae are normal. PERRL. Normal extraocular movements. ENT   Head:  Normocephalic and atraumatic.   Nose: No congestion/rhinnorhea.   Mouth/Throat: Mucous membranes are moist.   Neck: No stridor. Hematological/Lymphatic/Immunilogical: No cervical lymphadenopathy. Cardiovascular:  Normal rate, regular rhythm. Normal and symmetric distal pulses are present in all extremities. No murmurs, rubs, or gallops. Respiratory: Normal respiratory effort without tachypnea nor retractions. Breath sounds are clear and equal bilaterally. No wheezes/rales/rhonchi. Gastrointestinal: Soft and nontender. No distention. There is no CVA tenderness. Genitourinary: deferred Musculoskeletal: Nontender with normal range of motion in all extremities. No joint effusions.  No lower extremity tenderness nor edema. Neurologic:  Normal speech and language. No gross focal neurologic deficits are appreciated. Speech is normal.  Skin:  Generalized rash with excoriation consistent with scabies Psychiatric: Mood and affect are normal. Speech and behavior are normal. Patient exhibits appropriate insight and judgment.  ____________________________________________     Procedures     INITIAL IMPRESSION / ASSESSMENT AND PLAN / ED COURSE  Pertinent labs & imaging results that were available during my care of the patient were reviewed by me and considered in my medical decision making (see chart for details).  General S pruritic rash consistent with scabies as such patient given permethrin. Regarding tick bite with resultant symptoms concerning for possible Lyme disease as such patient given doxycycline  ____________________________________________   FINAL CLINICAL IMPRESSION(S) / ED DIAGNOSES  Final diagnoses:  Scabies  Lyme disease      Darci Currentandolph N Brown, MD 03/06/16 [phone]

## 2016-03-07 ENCOUNTER — Encounter: Payer: Self-pay | Admitting: Emergency Medicine

## 2016-03-07 ENCOUNTER — Emergency Department
Admission: EM | Admit: 2016-03-07 | Discharge: 2016-03-07 | Disposition: A | Discharge status: Home / Self Care | Payer: Self-pay | Attending: Student | Admitting: Student

## 2016-03-07 DIAGNOSIS — Z2089 Contact with and (suspected) exposure to other communicable diseases: Secondary | ICD-10-CM | POA: Insufficient documentation

## 2016-03-07 DIAGNOSIS — Z79899 Other long term (current) drug therapy: Secondary | ICD-10-CM | POA: Insufficient documentation

## 2016-03-07 DIAGNOSIS — Z87891 Personal history of nicotine dependence: Secondary | ICD-10-CM | POA: Insufficient documentation

## 2016-03-07 DIAGNOSIS — M1990 Unspecified osteoarthritis, unspecified site: Secondary | ICD-10-CM | POA: Insufficient documentation

## 2016-03-07 DIAGNOSIS — E039 Hypothyroidism, unspecified: Secondary | ICD-10-CM | POA: Insufficient documentation

## 2016-03-07 DIAGNOSIS — Z207 Contact with and (suspected) exposure to pediculosis, acariasis and other infestations: Secondary | ICD-10-CM

## 2016-03-07 DIAGNOSIS — F319 Bipolar disorder, unspecified: Secondary | ICD-10-CM | POA: Insufficient documentation

## 2016-03-07 MED ORDER — RANITIDINE HCL 150 MG PO TABS: 150.0000 mg | ORAL_TABLET | Freq: Two times a day (BID) | ORAL | Status: DC

## 2016-03-07 MED ORDER — PERMETHRIN 5 % EX CREA: TOPICAL_CREAM | CUTANEOUS | Status: DC

## 2016-03-07 MED ORDER — CYPROHEPTADINE HCL 4 MG PO TABS: 4.0000 mg | ORAL_TABLET | Freq: Three times a day (TID) | ORAL | Status: DC | PRN

## 2016-03-07 NOTE — Discharge Instructions (Signed)
Take the prescription antihistamines as directed. Repeat the lotion application in 1 week if needed. Take cooler showers/baths. Avoid excessive heat and sweating. [facility].

## 2016-03-07 NOTE — ED Notes (Signed)
Pt to ed with c/o itching and rash, dx with scabies, states she was seen here for same yesterday.  Pt states she used cream as directed but is still itching.

## 2016-03-07 NOTE — ED Provider Notes (Signed)
[facility] Emergency Department Provider Note ____________________________________________  Time seen: 1203  I have reviewed the triage vital signs and the nursing notes.  HISTORY  Chief Complaint  Rash  HPI [name] [name] is a 35 y.o. female returns to the ED for continued itching following a diagnosis and initial treatment for scabies exposure. She was seen here in the early morning hours yesterday, and discharged with a prescription for permethrin. She admits to initially only using the lotion to the irritated areas on her body. She then reviewed the instructions and has since applied the lotion as prescribed from the neck to the tips of the fingers and toes. She has not rinsed off the lotion at this point. She does admit to upon lotion after a very hot shower at the hotel where she sustained currently. She describes increased itching following the shower and despite outpatient of the lotion. She is also dosing doxycycline for a presumed tick borne illness as well.  Past Medical History  Diagnosis Date  . Hypothyroidism   . Anxiety   . Depression   . Bipolar disorder (HCC)   . PTSD (post-traumatic stress disorder)   . Suicide attempt (HCC)     once  . Gallstone   . Arthritis     Patient Active Problem List   Diagnosis Date Noted  . Hypothyroidism 05/29/2013  . Transaminitis 05/29/2013  . Cholestatic jaundice 05/29/2013  . Gallstones 05/29/2013  . Nausea and vomiting 05/29/2013  . Abdominal pain, epigastric 05/29/2013  . PTSD (post-traumatic stress disorder) 02/08/2013  . Social anxiety disorder 02/08/2013  . Bipolar affective disorder, depressed (HCC) 01/05/2012  . Psychoactive substance use disorder (HCC) 01/05/2012    Past Surgical History  Procedure Laterality Date  . Breast reduction surgery  2001  . Knee surgery Left     multiple.  meniscus repair, cyst removal  . Hip surgery Right 1999    slipped epiphysis  . Laparoscopic  cholecystectomy w/ cholangiography N/A 05/31/2013  . Cholecystectomy N/A 05/31/2013    Procedure: LAPAROSCOPIC CHOLECYSTECTOMY WITH INTRAOPERATIVE CHOLANGIOGRAM WITH COMMON BILE DUCT EXPLORATION;  Surgeon: Dalia Heading, MD;  Location: [facility];  Service: General;  Laterality: N/A;  . Ercp N/A 06/01/2013    Procedure: ENDOSCOPIC RETROGRADE CHOLANGIOPANCREATOGRAPHY (ERCP);  Surgeon: Corbin Ade, MD;  Location: [facility];  Service: Endoscopy;  Laterality: N/A;  . Sphincterotomy N/A 06/01/2013    Procedure: SPHINCTEROTOMY;  Surgeon: Corbin Ade, MD;  Location: [facility];  Service: Endoscopy;  Laterality: N/A;  . Removal of stones N/A 06/01/2013    Procedure: REMOVAL OF STONES;  Surgeon: Corbin Ade, MD;  Location: [facility];  Service: Endoscopy;  Laterality: N/A;    Current Outpatient Rx  Name  Route  Sig  Dispense  Refill  . benzonatate (TESSALON PERLES) 100 MG capsule   Oral   Take 1 capsule (100 mg total) by mouth every 6 (six) hours as needed for cough.   30 capsule   0   . citalopram (CELEXA) 20 MG tablet   Oral   Take 1 tablet (20 mg total) by mouth every morning.   30 tablet   2   . cyproheptadine (PERIACTIN) 4 MG tablet   Oral   Take 1 tablet (4 mg total) by mouth 3 (three) times daily as needed for allergies.   30 tablet   0   . doxycycline (VIBRA-TABS) 100 MG tablet   Oral   Take 1 tablet (100 mg total) by mouth 2 (  two) times daily.   28 tablet   0   . famotidine (PEPCID) 20 MG tablet   Oral   Take 1 tablet (20 mg total) by mouth 2 (two) times daily.   10 tablet   0   . guaiFENesin (MUCINEX) 600 MG 12 hr tablet   Oral   Take 600 mg by mouth 2 (two) times daily.         Marland Kitchen. ibuprofen (ADVIL,MOTRIN) 800 MG tablet   Oral   Take 1 tablet (800 mg total) by mouth every 8 (eight) hours as needed.   20 tablet   0   . levothyroxine (SYNTHROID, LEVOTHROID) 25 MCG tablet   Oral   Take 37.5 mcg by mouth daily before breakfast.         . naproxen (NAPROSYN) 500 MG  tablet   Oral   Take 1 tablet (500 mg total) by mouth 2 (two) times daily with a meal.   30 tablet   0   . ondansetron (ZOFRAN-ODT) 4 MG disintegrating tablet   Oral   Take 1 tablet (4 mg total) by mouth every 8 (eight) hours as needed for nausea.   20 tablet   0   . permethrin (ELIMITE) 5 % cream      Apply from neck to feet x 1 Rinse after 24 hours.   60 g   1   . predniSONE (DELTASONE) 10 MG tablet   Oral   Take 2 tablets (20 mg total) by mouth daily.   15 tablet   0   . ranitidine (ZANTAC) 150 MG tablet   Oral   Take 1 tablet (150 mg total) by mouth 2 (two) times daily.   20 tablet   0    Allergies Ativan  Family History  Problem Relation Age of Onset  . Anesthesia problems Neg Hx   . Hypotension Neg Hx   . Malignant hyperthermia Neg Hx   . Pseudochol deficiency Neg Hx   . Dementia Neg Hx   . Depression Neg Hx   . Schizophrenia Neg Hx   . Seizures Neg Hx   . Sexual abuse Mother     By her father  . Alcohol abuse Mother   . Anxiety disorder Mother   . Bipolar disorder Mother   . OCD Mother   . Physical abuse Mother   . ADD / ADHD Sister   . Drug abuse Sister   . Bipolar disorder Sister   . Anxiety disorder Sister   . Paranoid behavior Sister   . ADD / ADHD Sister   . OCD Maternal Aunt   . Cancer Maternal Grandfather   . Mental illness Maternal Grandfather   . ADD / ADHD Other   . Brain cancer Other   . Colon cancer Neg Hx     Social History Social History  Substance Use Topics  . Smoking status: Former Smoker -- 2.00 packs/day for 15 years    Types: Cigarettes    Quit date: 05/01/2012  . Smokeless tobacco: Never Used  . Alcohol Use: No     Comment: Used drink frequently 1 year ago. now once/month   Review of Systems  Constitutional: Negative for fever. Cardiovascular: Negative for chest pain. Respiratory: Negative for shortness of breath. Skin: Positive for rash & itching Neurological: Negative for headaches, focal weakness or  numbness. ____________________________________________  PHYSICAL EXAM:  VITAL SIGNS: ED Triage Vitals  Enc Vitals Group     BP --  Pulse --      Resp --      Temp --      Temp src --      SpO2 --      Weight --      Height --      Head Cir --      Peak Flow --      Pain Score 03/07/16 1138 6     Pain Loc --      Pain Edu? --      Excl. in GC? --     Constitutional: Alert and oriented. Well appearing and in no distress. Head: Normocephalic and atraumatic. Respiratory: Normal respiratory effort.  Musculoskeletal: Nontender with normal range of motion in all extremities.  Neurologic:  Normal gait without ataxia. Normal speech and language. No gross focal neurologic deficits are appreciated. Skin:  Skin is warm, dry and intact. Patient with a scattered, macular rash noted to the chest, back and arms.  Psychiatric: Mood and affect are normal. Patient exhibits appropriate insight and judgment. ____________________________________________  INITIAL IMPRESSION / ASSESSMENT AND PLAN / ED COURSE  Patient with exposure to scabies with continued pruritic rash. Patient is advised that she will likely have itching despite the successful use of the lotion. She is advised to utilize antihistamine means for symptom relief. She is also advised against hot showers in the interim. She is further advised that since the lotion as directed is important to help alleviate scabies. She is discharged with a prescription for permethrin lotion as well as ranitidine and Periactin. She will follow with primary care provider as needed. ____________________________________________  FINAL CLINICAL IMPRESSION(S) / ED DIAGNOSES  Final diagnoses:  Exposure to scabies     Lissa Hoard, PA-C 03/07/16 1247  Gayla Doss, MD 03/07/16 [id number]

## 2016-04-01 ENCOUNTER — Emergency Department
Admission: EM | Admit: 2016-04-01 | Discharge: 2016-04-01 | Disposition: A | Discharge status: Home / Self Care | Payer: Self-pay | Attending: Student in an Organized Health Care Education/Training Program | Admitting: Student in an Organized Health Care Education/Training Program

## 2016-04-01 ENCOUNTER — Emergency Department: Payer: Self-pay

## 2016-04-01 ENCOUNTER — Encounter: Payer: Self-pay | Admitting: Emergency Medicine

## 2016-04-01 DIAGNOSIS — S39012A Strain of muscle, fascia and tendon of lower back, initial encounter: Secondary | ICD-10-CM | POA: Insufficient documentation

## 2016-04-01 DIAGNOSIS — Y929 Unspecified place or not applicable: Secondary | ICD-10-CM | POA: Insufficient documentation

## 2016-04-01 DIAGNOSIS — M5441 Lumbago with sciatica, right side: Secondary | ICD-10-CM | POA: Insufficient documentation

## 2016-04-01 DIAGNOSIS — Z791 Long term (current) use of non-steroidal anti-inflammatories (NSAID): Secondary | ICD-10-CM | POA: Insufficient documentation

## 2016-04-01 DIAGNOSIS — X500XXA Overexertion from strenuous movement or load, initial encounter: Secondary | ICD-10-CM | POA: Insufficient documentation

## 2016-04-01 DIAGNOSIS — Z79899 Other long term (current) drug therapy: Secondary | ICD-10-CM | POA: Insufficient documentation

## 2016-04-01 DIAGNOSIS — M5431 Sciatica, right side: Secondary | ICD-10-CM

## 2016-04-01 DIAGNOSIS — E039 Hypothyroidism, unspecified: Secondary | ICD-10-CM | POA: Insufficient documentation

## 2016-04-01 DIAGNOSIS — Z87891 Personal history of nicotine dependence: Secondary | ICD-10-CM | POA: Insufficient documentation

## 2016-04-01 DIAGNOSIS — Y999 Unspecified external cause status: Secondary | ICD-10-CM | POA: Insufficient documentation

## 2016-04-01 DIAGNOSIS — Y9389 Activity, other specified: Secondary | ICD-10-CM | POA: Insufficient documentation

## 2016-04-01 LAB — POCT PREGNANCY, URINE: Preg Test, Ur: NEGATIVE

## 2016-04-01 MED ORDER — PREDNISONE 10 MG PO TABS: ORAL_TABLET | ORAL | 0 refills | Status: DC

## 2016-04-01 MED ORDER — KETOROLAC TROMETHAMINE 30 MG/ML IJ SOLN
30.0000 mg | Freq: Once | INTRAMUSCULAR | Status: AC
  Administered 2016-04-01: 30 mg via INTRAMUSCULAR
  Filled 2016-04-01: qty 1

## 2016-04-01 MED ORDER — CYCLOBENZAPRINE HCL 10 MG PO TABS: 10.0000 mg | ORAL_TABLET | Freq: Three times a day (TID) | ORAL | 0 refills | Status: DC | PRN

## 2016-04-01 NOTE — ED Triage Notes (Signed)
States two weeks ago while helping sister moved, injured back while moving a washing machine.  States pain has worsened over the past two weeks.  Has been using ice, heat, and neurontin (mother's RX), no relief.

## 2016-04-01 NOTE — ED Provider Notes (Signed)
[facility] Emergency Department Provider Note  ____________________________________________  Time seen: Approximately 12:48 PM  I have reviewed the triage vital signs and the nursing notes.   HISTORY  Chief Complaint Back Pain    HPI [name] [name] is a 35 y.o. female, NAD, presents to the emergency department for evaluation of 1 week history of lower back pain. Patient states that one week ago she was helping her father move a washing machine when he dropped the machine while she was still lifting it. Patient denies immediate pain, but notes that a few hours after the incident lower back pain began. She is currently experiencing 9/10 pain. Patient notes that the pain can radiate from the right lower back down the right leg. She has been taking Tylenol for pain with minimal relief. She has also been applying heat and ice the affected area periodically for the past week without resolution of symptoms. She denies numbness, weakness, tingling, saddle paresthesias, loss of bowel or bladder control. Denies any redness, swelling, bruising, rash about the back. Denies any traumas or falls.   Past Medical History:  Diagnosis Date  . Anxiety   . Arthritis   . Bipolar disorder (HCC)   . Depression   . Gallstone   . Hypothyroidism   . PTSD (post-traumatic stress disorder)   . Suicide attempt [facility])    once    Patient Active Problem List   Diagnosis Date Noted  . Hypothyroidism 05/29/2013  . Transaminitis 05/29/2013  . Cholestatic jaundice 05/29/2013  . Gallstones 05/29/2013  . Nausea and vomiting 05/29/2013  . Abdominal pain, epigastric 05/29/2013  . PTSD (post-traumatic stress disorder) 02/08/2013  . Social anxiety disorder 02/08/2013  . Bipolar affective disorder, depressed (HCC) 01/05/2012  . Psychoactive substance use disorder (HCC) 01/05/2012    Past Surgical History:  Procedure Laterality Date  . BREAST REDUCTION SURGERY  2001  . CHOLECYSTECTOMY  N/A 05/31/2013   Procedure: LAPAROSCOPIC CHOLECYSTECTOMY WITH INTRAOPERATIVE CHOLANGIOGRAM WITH COMMON BILE DUCT EXPLORATION;  Surgeon: Dalia Heading, MD;  Location: [facility];  Service: General;  Laterality: N/A;  . ERCP N/A 06/01/2013   Procedure: ENDOSCOPIC RETROGRADE CHOLANGIOPANCREATOGRAPHY (ERCP);  Surgeon: Corbin Ade, MD;  Location: [facility];  Service: Endoscopy;  Laterality: N/A;  . HIP SURGERY Right 1999   slipped epiphysis  . KNEE SURGERY Left    multiple.  meniscus repair, cyst removal  . LAPAROSCOPIC CHOLECYSTECTOMY W/ CHOLANGIOGRAPHY N/A 05/31/2013  . REMOVAL OF STONES N/A 06/01/2013   Procedure: REMOVAL OF STONES;  Surgeon: Corbin Ade, MD;  Location: [facility];  Service: Endoscopy;  Laterality: N/A;  . SPHINCTEROTOMY N/A 06/01/2013   Procedure: SPHINCTEROTOMY;  Surgeon: Corbin Ade, MD;  Location: [facility];  Service: Endoscopy;  Laterality: N/A;    Prior to Admission medications   Medication Sig Start Date End Date Taking? Authorizing Provider  benzonatate (TESSALON PERLES) 100 MG capsule Take 1 capsule (100 mg total) by mouth every 6 (six) hours as needed for cough. 10/10/15 10/09/16  Phineas Semen, MD  citalopram (CELEXA) 20 MG tablet Take 1 tablet (20 mg total) by mouth every morning. 07/03/13   Myrlene Broker, MD  cyclobenzaprine (FLEXERIL) 10 MG tablet Take 1 tablet (10 mg total) by mouth 3 (three) times daily as needed for muscle spasms. 04/01/16   Broden Holt L Etoile Looman, PA-C  cyproheptadine (PERIACTIN) 4 MG tablet Take 1 tablet (4 mg total) by mouth 3 (three) times daily as needed for allergies. 03/07/16   Charlesetta Ivory  Menshew, PA-C  famotidine (PEPCID) 20 MG tablet Take 1 tablet (20 mg total) by mouth 2 (two) times daily. 06/17/14   Bethann Berkshire, MD  guaiFENesin (MUCINEX) 600 MG 12 hr tablet Take 600 mg by mouth 2 (two) times daily.    Historical Provider, MD  ibuprofen (ADVIL,MOTRIN) 800 MG tablet Take 1 tablet (800 mg total) by mouth every 8 (eight) hours as needed. 10/16/15    Anne-Caroline Sharma Covert, MD  levothyroxine (SYNTHROID, LEVOTHROID) 25 MCG tablet Take 37.5 mcg by mouth daily before breakfast.    Historical Provider, MD  naproxen (NAPROSYN) 500 MG tablet Take 1 tablet (500 mg total) by mouth 2 (two) times daily with a meal. 12/20/15   Lynita Groseclose L Jasmin Winberry, PA-C  ondansetron (ZOFRAN-ODT) 4 MG disintegrating tablet Take 1 tablet (4 mg total) by mouth every 8 (eight) hours as needed for nausea. 10/16/15   Anne-Caroline Sharma Covert, MD  permethrin (ELIMITE) 5 % cream Apply from neck to feet x 1 Rinse after 24 hours. 03/07/16   Jenise V Bacon Menshew, PA-C  predniSONE (DELTASONE) 10 MG tablet Take a daily regimen of 6,5,4,3,2,1 04/01/16   Xena Propst L Keyshawna Prouse, PA-C  ranitidine (ZANTAC) 150 MG tablet Take 1 tablet (150 mg total) by mouth 2 (two) times daily. 03/07/16   Jenise V Bacon Menshew, PA-C    Allergies Ativan [lorazepam]  Family History  Problem Relation Age of Onset  . Sexual abuse Mother     By her father  . Alcohol abuse Mother   . Anxiety disorder Mother   . Bipolar disorder Mother   . OCD Mother   . Physical abuse Mother   . ADD / ADHD Sister   . Drug abuse Sister   . Bipolar disorder Sister   . Anxiety disorder Sister   . Paranoid behavior Sister   . ADD / ADHD Sister   . Cancer Maternal Grandfather   . Mental illness Maternal Grandfather   . ADD / ADHD Other   . Brain cancer Other   . OCD Maternal Aunt   . Anesthesia problems Neg Hx   . Hypotension Neg Hx   . Malignant hyperthermia Neg Hx   . Pseudochol deficiency Neg Hx   . Dementia Neg Hx   . Depression Neg Hx   . Schizophrenia Neg Hx   . Seizures Neg Hx   . Colon cancer Neg Hx     Social History Social History  Substance Use Topics  . Smoking status: Former Smoker    Packs/day: 2.00    Years: 15.00    Types: Cigarettes    Quit date: 05/01/2012  . Smokeless tobacco: Never Used  . Alcohol use No     Comment: Used drink frequently 1 year ago. now once/month     Review of Systems   Constitutional: No Fatigue Cardiovascular: No chest pain. Respiratory:  No shortness of breath. No wheezing.  Musculoskeletal: Positive lower back pain radiating into right lower extremity. No neck pain. Skin: Negative for rash, redness, swelling, skin sores, bruising. Neurological: Negative for headaches, focal weakness or numbness. Nose paresthesias, tingling. 10-point ROS otherwise negative.  ____________________________________________   PHYSICAL EXAM:  VITAL SIGNS: ED Triage Vitals  Enc Vitals Group     BP 04/01/16 1226 (!) 138/93     Pulse Rate 04/01/16 1226 85     Resp 04/01/16 1226 18     Temp 04/01/16 1226 98.1 F (36.7 C)     Temp Source 04/01/16 1226 Oral     SpO2 --  Weight 04/01/16 1226 190 lb (86.2 kg)     Height 04/01/16 1226 5\' 11"  (1.803 m)     Head Circumference --      Peak Flow --      Pain Score 04/01/16 1228 9     Pain Loc --      Pain Edu? --      Excl. in GC? --      Constitutional: Alert and oriented. Well appearing and in no acute distress but is tearful and sitting uncomfortably on ED bed due to back pain. Eyes: Conjunctivae are normal without icterus or injection. Neck: Supple with full range of motion. No trapezial muscle spasms noted. Cardiovascular: Normal rate, regular rhythm. Normal S1 and S2.  Good peripheral circulation, pedal pulses intact with 2+ pulses in bilateral lower extremities.  Respiratory: Normal respiratory effort without tachypnea or retractions. Lungs CTAB with breath sounds noted in all lung fields. Musculoskeletal: No lower extremity tenderness nor edema.  No joint effusions. Pain with palpation to the lumbosacral paraspinal areas. Strength 5/5 in bilateral lower extremities. Positive right SI joint tenderness with deep palpation. No left SI joint tenderness with deep palpation. Positive right straight leg raise. Negative left straight leg raise. Decreased range of motion of lumbar spine due to pain. Neurologic:  Normal  speech and language. No gross focal neurologic deficits are appreciated. Sensation and light touch grossly normal in bilateral lower extremities. Skin:  Skin is warm, dry and intact. No rash, redness, swelling, ecchymosis, skin sores Psychiatric: Mood and affect are normal. Speech and behavior are normal. Patient exhibits appropriate insight and judgement.   ____________________________________________   LABS (all labs ordered are listed, but only abnormal results are displayed)  Labs Reviewed  POCT PREGNANCY, URINE  POC URINE PREG, ED   ____________________________________________  EKG  None ____________________________________________  RADIOLOGY I have personally viewed and evaluated these images (plain radiographs) as part of my medical decision making, as well as reviewing the written report by the radiologist.  Dg Lumbar Spine 2-3 Views  Result Date: 04/01/2016 CLINICAL DATA:  Low back pain for 7 days. Injury carrying heavy object. EXAM: LUMBAR SPINE - 2-3 VIEW COMPARISON:  None. FINDINGS: There is no evidence of lumbar spine fracture. Alignment is normal. Intervertebral disc spaces are maintained. IMPRESSION: Negative. Electronically Signed   By: Charlett Nose M.D.   On: 04/01/2016 13:55    ____________________________________________    PROCEDURES  Procedure(s) performed: None   Procedures   Medications  ketorolac (TORADOL) 30 MG/ML injection 30 mg (30 mg Intramuscular Given 04/01/16 1312)     ____________________________________________   INITIAL IMPRESSION / ASSESSMENT AND PLAN / ED COURSE  Pertinent labs & imaging results that were available during my care of the patient were reviewed by me and considered in my medical decision making (see chart for details).  Clinical Course    Patient's diagnosis is consistent with Sciatica of the right side due to low back strain. Patient will be discharged home with prescriptions for Flexeril and prednisone to take  as directed. Patient advised to complete range of motion and stretching exercises as tolerated 2-3 times daily. Patient is to follow up with her primary care provider or [facility] if symptoms persist past this treatment course. Patient is given ED precautions to return to the ED for any worsening or new symptoms.      ____________________________________________  FINAL CLINICAL IMPRESSION(S) / ED DIAGNOSES  Final diagnoses:  Sciatica of right side  Low back strain, initial encounter  NEW MEDICATIONS STARTED DURING THIS VISIT:  New Prescriptions   CYCLOBENZAPRINE (FLEXERIL) 10 MG TABLET    Take 1 tablet (10 mg total) by mouth 3 (three) times daily as needed for muscle spasms.   PREDNISONE (DELTASONE) 10 MG TABLET    Take a daily regimen of 6,5,4,3,2,1         Hope Pigeon, PA-C 04/01/16 1411    Willy Eddy, MD 04/01/16 1553

## 2016-04-01 NOTE — ED Notes (Signed)
Pt in via triage; pt reports helping her sister move a washing machine approximately 2 weeks ago.  Pt reports pain to right lower back that radiates down leg since the following day which has "progressively gotten worse."  Pt ambulatory to room.  Pt in no immediate distress.

## 2017-04-17 ENCOUNTER — Emergency Department
Admission: EM | Admit: 2017-04-17 | Discharge: 2017-04-17 | Disposition: A | Discharge status: Home / Self Care | Payer: BLUE CROSS/BLUE SHIELD | Attending: Emergency Medicine | Admitting: Emergency Medicine

## 2017-04-17 ENCOUNTER — Encounter: Payer: Self-pay | Admitting: Emergency Medicine

## 2017-04-17 ENCOUNTER — Emergency Department: Payer: BLUE CROSS/BLUE SHIELD

## 2017-04-17 DIAGNOSIS — M25562 Pain in left knee: Secondary | ICD-10-CM | POA: Insufficient documentation

## 2017-04-17 DIAGNOSIS — M1712 Unilateral primary osteoarthritis, left knee: Secondary | ICD-10-CM | POA: Diagnosis not present

## 2017-04-17 DIAGNOSIS — G8929 Other chronic pain: Secondary | ICD-10-CM

## 2017-04-17 DIAGNOSIS — Z79899 Other long term (current) drug therapy: Secondary | ICD-10-CM | POA: Diagnosis not present

## 2017-04-17 DIAGNOSIS — F319 Bipolar disorder, unspecified: Secondary | ICD-10-CM | POA: Diagnosis not present

## 2017-04-17 DIAGNOSIS — E039 Hypothyroidism, unspecified: Secondary | ICD-10-CM | POA: Insufficient documentation

## 2017-04-17 DIAGNOSIS — Z87891 Personal history of nicotine dependence: Secondary | ICD-10-CM | POA: Insufficient documentation

## 2017-04-17 MED ORDER — MELOXICAM 15 MG PO TABS: 15.0000 mg | ORAL_TABLET | Freq: Every day | ORAL | 0 refills | Status: DC

## 2017-04-17 NOTE — ED Triage Notes (Signed)
Presents with left knee pain  Denies any injury to knee

## 2017-04-17 NOTE — ED Provider Notes (Signed)
[facility] Emergency Department Provider Note  ____________________________________________  Time seen: Approximately 4:06 PM  I have reviewed the triage vital signs and the nursing notes.   HISTORY  Chief Complaint Knee Pain    HPI [name] [name] is a 36 y.o. female who presents emergency department complaining of left knee pain. Patient reports that the pain is chronic but has been increasing. She reports that she had previous injury with meniscal surgery in the past. Patient reports that she has intermittent knee pain but that pain has been constant and increasing over the past several days. Patient reports that she works on her feet and is not tolerating the pain without this time. Patient has not tried any over-the-counter medications for this complaint prior to arrival. No direct injury or trauma to the left knee.   Past Medical History:  Diagnosis Date  . Anxiety   . Arthritis   . Bipolar disorder (HCC)   . Depression   . Gallstone   . Hypothyroidism   . PTSD (post-traumatic stress disorder)   . Suicide attempt [facility])    once    Patient Active Problem List   Diagnosis Date Noted  . Hypothyroidism 05/29/2013  . Transaminitis 05/29/2013  . Cholestatic jaundice 05/29/2013  . Gallstones 05/29/2013  . Nausea and vomiting 05/29/2013  . Abdominal pain, epigastric 05/29/2013  . PTSD (post-traumatic stress disorder) 02/08/2013  . Social anxiety disorder 02/08/2013  . Bipolar affective disorder, depressed (HCC) 01/05/2012  . Psychoactive substance use disorder 01/05/2012    Past Surgical History:  Procedure Laterality Date  . BREAST REDUCTION SURGERY  2001  . CHOLECYSTECTOMY N/A 05/31/2013   Procedure: LAPAROSCOPIC CHOLECYSTECTOMY WITH INTRAOPERATIVE CHOLANGIOGRAM WITH COMMON BILE DUCT EXPLORATION;  Surgeon: Dalia Heading, MD;  Location: [facility] ORS;  Service: General;  Laterality: N/A;  . ERCP N/A 06/01/2013   Procedure: ENDOSCOPIC RETROGRADE  CHOLANGIOPANCREATOGRAPHY (ERCP);  Surgeon: Corbin Ade, MD;  Location: [facility] ORS;  Service: Endoscopy;  Laterality: N/A;  . HIP SURGERY Right 1999   slipped epiphysis  . KNEE SURGERY Left    multiple.  meniscus repair, cyst removal  . LAPAROSCOPIC CHOLECYSTECTOMY W/ CHOLANGIOGRAPHY N/A 05/31/2013  . REMOVAL OF STONES N/A 06/01/2013   Procedure: REMOVAL OF STONES;  Surgeon: Corbin Ade, MD;  Location: [facility] OR[facility];  Service: Endoscopy;  Laterality: N/A;  . SPHINCTEROTOMY N/A 06/01/2013   Procedure: SPHINCTEROTOMY;  Surgeon: Corbin Ade, MD;  Location: [facility];  Service: Endoscopy;  Laterality: N/A;    Prior to Admission medications   Medication Sig Start Date End Date Taking? Authorizing Provider  citalopram (CELEXA) 20 MG tablet Take 1 tablet (20 mg total) by mouth every morning. 07/03/13   Myrlene Broker, MD  cyclobenzaprine (FLEXERIL) 10 MG tablet Take 1 tablet (10 mg total) by mouth 3 (three) times daily as needed for muscle spasms. 04/01/16   Hagler, Jami L, PA-C  cyproheptadine (PERIACTIN) 4 MG tablet Take 1 tablet (4 mg total) by mouth 3 (three) times daily as needed for allergies. 03/07/16   Menshew, Charlesetta Ivory, PA-C  famotidine (PEPCID) 20 MG tablet Take 1 tablet (20 mg total) by mouth 2 (two) times daily. 06/17/14   Bethann Berkshire, MD  guaiFENesin (MUCINEX) 600 MG 12 hr tablet Take 600 mg by mouth 2 (two) times daily.    [provider]  ibuprofen (ADVIL,MOTRIN) 800 MG tablet Take 1 tablet (800 mg total) by mouth every 8 (eight) hours as needed. 10/16/15   Rockne Menghini, MD  levothyroxine (SYNTHROID, LEVOTHROID) 25 MCG tablet Take 37.5 mcg by mouth daily before breakfast.    [provider]  meloxicam (MOBIC) 15 MG tablet Take 1 tablet (15 mg total) by mouth daily. 04/17/17   Eldridge Marcott, Delorise Royals, PA-C  naproxen (NAPROSYN) 500 MG tablet Take 1 tablet (500 mg total) by mouth 2 (two) times daily with a meal. 12/20/15   Hagler, Jami L, PA-C  ondansetron (ZOFRAN-ODT)  4 MG disintegrating tablet Take 1 tablet (4 mg total) by mouth every 8 (eight) hours as needed for nausea. 10/16/15   Rockne Menghini, MD  permethrin (ELIMITE) 5 % cream Apply from neck to feet x 1 Rinse after 24 hours. 03/07/16   Menshew, Charlesetta Ivory, PA-C  predniSONE (DELTASONE) 10 MG tablet Take a daily regimen of 6,5,4,3,2,1 04/01/16   Hagler, Jami L, PA-C  ranitidine (ZANTAC) 150 MG tablet Take 1 tablet (150 mg total) by mouth 2 (two) times daily. 03/07/16   Menshew, Charlesetta Ivory, PA-C    Allergies Ativan [lorazepam]  Family History  Problem Relation Age of Onset  . Sexual abuse Mother        By her father  . Alcohol abuse Mother   . Anxiety disorder Mother   . Bipolar disorder Mother   . OCD Mother   . Physical abuse Mother   . ADD / ADHD Sister   . Drug abuse Sister   . Bipolar disorder Sister   . Anxiety disorder Sister   . Paranoid behavior Sister   . ADD / ADHD Sister   . Cancer Maternal Grandfather   . Mental illness Maternal Grandfather   . ADD / ADHD Other   . Brain cancer Other   . OCD Maternal Aunt   . Anesthesia problems Neg Hx   . Hypotension Neg Hx   . Malignant hyperthermia Neg Hx   . Pseudochol deficiency Neg Hx   . Dementia Neg Hx   . Depression Neg Hx   . Schizophrenia Neg Hx   . Seizures Neg Hx   . Colon cancer Neg Hx     Social History Social History  Substance Use Topics  . Smoking status: Former Smoker    Packs/day: 2.00    Years: 15.00    Types: Cigarettes    Quit date: 05/01/2012  . Smokeless tobacco: Never Used  . Alcohol use No     Comment: Used drink frequently 1 year ago. now once/month     Review of Systems  Constitutional: No fever/chills Cardiovascular: no chest pain. Respiratory: no cough. No SOB. Musculoskeletal: Positive for left knee pain Skin: Negative for rash, abrasions, lacerations, ecchymosis. Neurological: Negative for headaches, focal weakness or numbness. 10-point ROS otherwise  negative.  ____________________________________________   PHYSICAL EXAM:  VITAL SIGNS: ED Triage Vitals  Enc Vitals Group     BP 04/17/17 1307 135/79     Pulse Rate 04/17/17 1307 86     Resp 04/17/17 1307 20     Temp 04/17/17 1307 98.3 F (36.8 C)     Temp Source 04/17/17 1307 Oral     SpO2 04/17/17 1307 98 %     Weight 04/17/17 1307 180 lb (81.6 kg)     Height 04/17/17 1307 5\' 10"  (1.778 m)     Head Circumference --      Peak Flow --      Pain Score 04/17/17 1310 9     Pain Loc --      Pain Edu? --  Excl. in GC? --      Constitutional: Alert and oriented. Well appearing and in no acute distress. Eyes: Conjunctivae are normal. PERRL. EOMI. Head: Atraumatic. Neck: No stridor.    Cardiovascular: Normal rate, regular rhythm. Normal S1 and S2.  Good peripheral circulation. Respiratory: Normal respiratory effort without tachypnea or retractions. Lungs CTAB. Good air entry to the bases with no decreased or absent breath sounds. Musculoskeletal: Full range of motion to all extremities. No gross deformities appreciated. No gross deformity or edema noted to left knee but inspection. Previous laparoscopic surgical scars are appreciated. Patient is nontender to palpation over the popliteal fossa region. No palpable abnormality. Varus, valgus, Lachman's, McMurray's is negative. Dorsalis pulses intact distally. Sensation intact distally. Neurologic:  Normal speech and language. No gross focal neurologic deficits are appreciated.  Skin:  Skin is warm, dry and intact. No rash noted. Psychiatric: Mood and affect are normal. Speech and behavior are normal. Patient exhibits appropriate insight and judgement.   ____________________________________________   LABS (all labs ordered are listed, but only abnormal results are displayed)  Labs Reviewed - No data to display ____________________________________________  EKG   ____________________________________________  RADIOLOGY Festus Barren Kaytlynne Neace, personally viewed and evaluated these images (plain radiographs) as part of my medical decision making, as well as reviewing the written report by the radiologist.  Dg Knee Complete 4 Views Left  Result Date: 04/17/2017 CLINICAL DATA:  Progressive pain EXAM: LEFT KNEE - COMPLETE 4+ VIEW COMPARISON:  None. FINDINGS: Frontal, lateral, and bilateral oblique views were obtained. There is no fracture or dislocation. No joint effusion. There is mild narrowing of all joint spaces with spurring in all compartments. No erosive change peer IMPRESSION: Mild osteoarthritic change involving all compartments. No fracture or joint effusion. Electronically Signed   By: Bretta Bang III M.D.   On: 04/17/2017 14:06    ____________________________________________    PROCEDURES  Procedure(s) performed:    Procedures    Medications - No data to display   ____________________________________________   INITIAL IMPRESSION / ASSESSMENT AND PLAN / ED COURSE  Pertinent labs & imaging results that were available during my care of the patient were reviewed by me and considered in my medical decision making (see chart for details).  Review of the [place] CSRS was performed in accordance of the [facility] prior to dispensing any controlled drugs.     Patient's diagnosis is consistent with left knee pain with osteoarthritis of the left knee.. Patient has history of previous meniscal surgery from an injury. X-ray reveals no acute osseous abnormality. This time, patient has tricompartmental degenerative joint disease. Patient's symptoms are consistent with increased pain from degenerative osteoarthritis.. Patient will be discharged home with prescriptions for anti-inflammatories for symptom control. Patient is to follow up with orthopedics as needed or otherwise directed. Patient is given ED precautions to return to the ED for any worsening or new  symptoms.     ____________________________________________  FINAL CLINICAL IMPRESSION(S) / ED DIAGNOSES  Final diagnoses:  Chronic pain of left knee  Arthritis of left knee      NEW MEDICATIONS STARTED DURING THIS VISIT:  Discharge Medication List as of 04/17/2017  4:22 PM    START taking these medications   Details  meloxicam (MOBIC) 15 MG tablet Take 1 tablet (15 mg total) by mouth daily., Starting Sat 04/17/2017, Print            This chart was dictated using voice recognition software/Dragon. Despite best efforts to proofread, errors can  occur which can change the meaning. Any change was purely unintentional.    Racheal Patches, PA-C 04/17/17 1718    Governor Rooks, MD 04/18/17 1524

## 2018-06-02 IMAGING — CR DG KNEE COMPLETE 4+V*L*
4 series · 4 of 4 positions shown · non-contrast
Comparison: None.

CLINICAL DATA: Progressive pain

EXAM:
LEFT KNEE - COMPLETE 4+ VIEW

[knee ap]
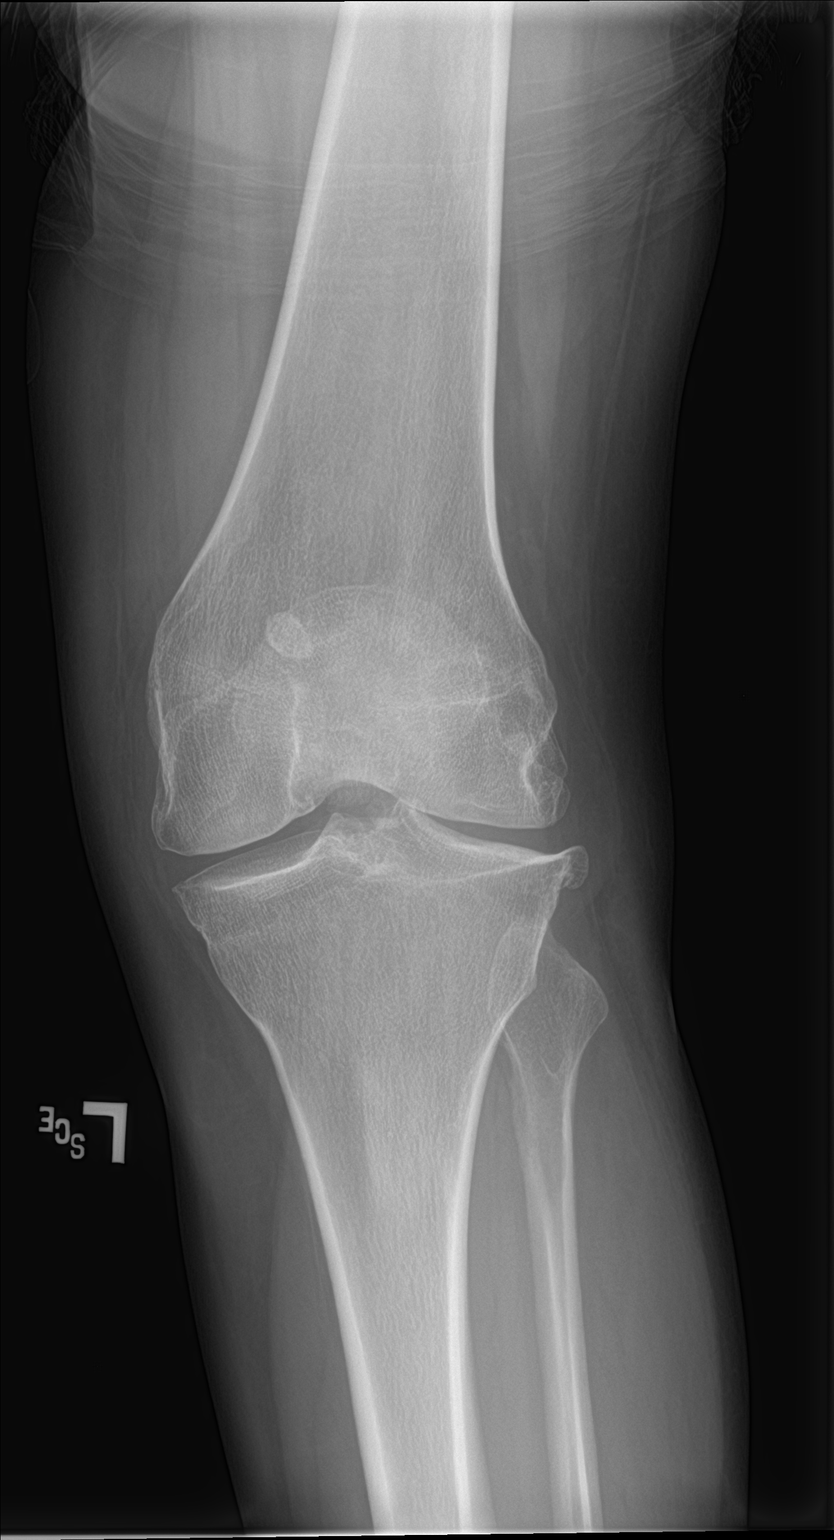

[knee obl (1 of 2)]
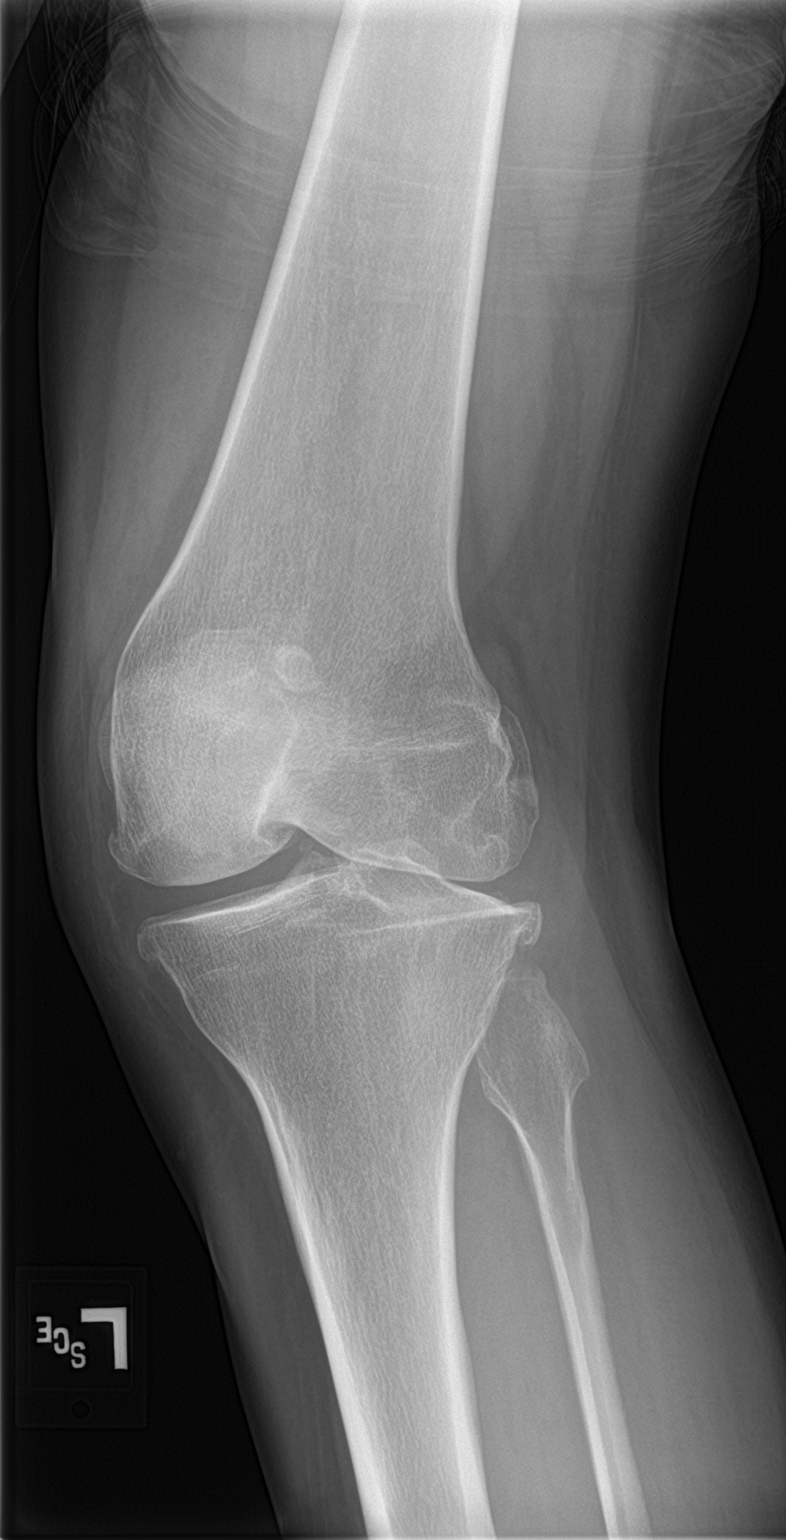

[knee obl (2 of 2)]
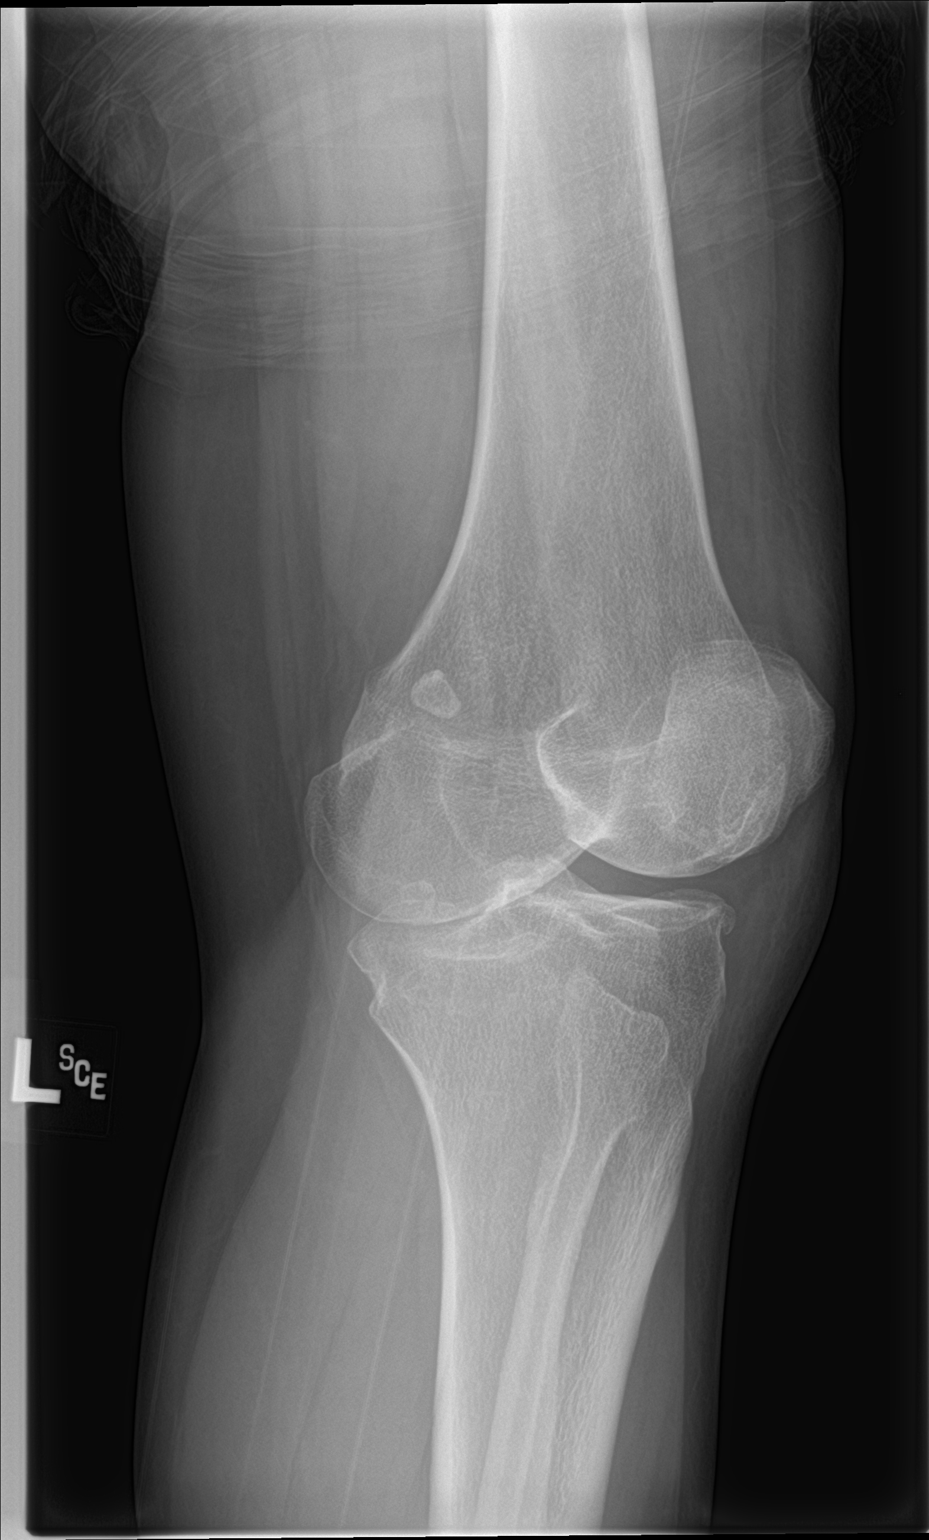

[knee lat]
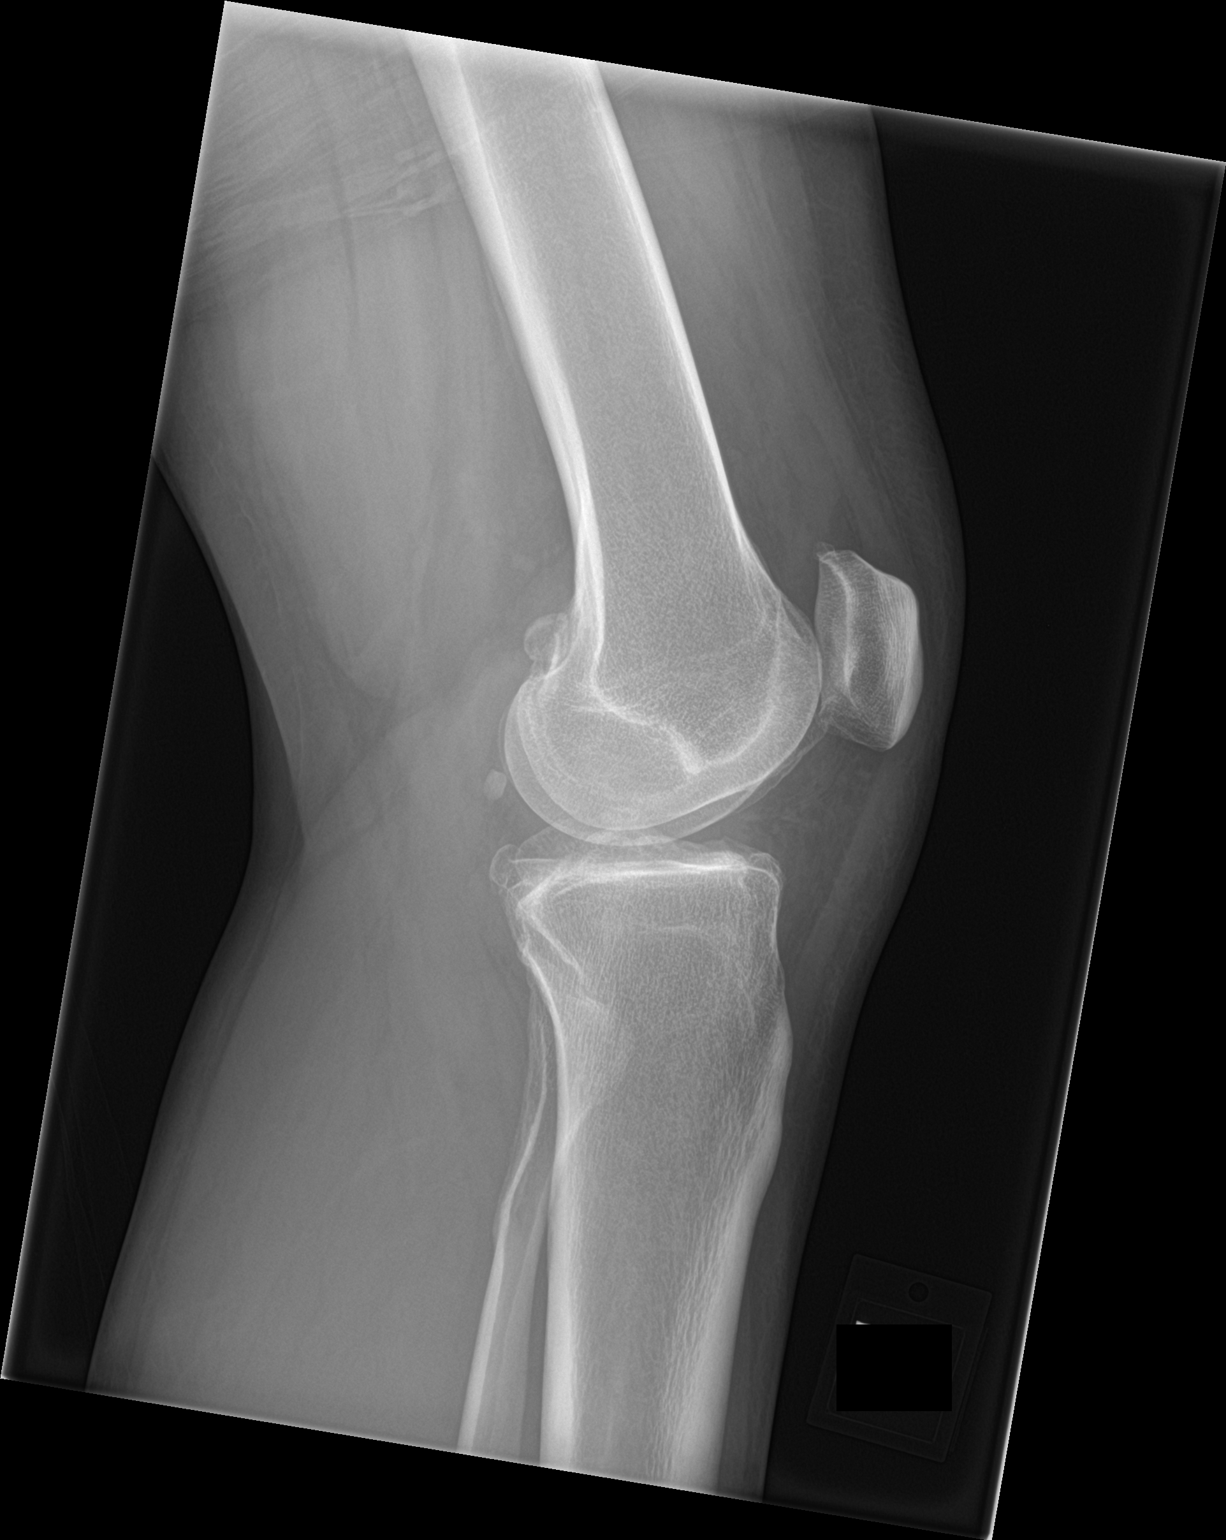

[4 of 4 positions shown; findings below may reference images not displayed]

FINDINGS: Frontal, lateral, and bilateral oblique views were obtained. There
is no fracture or dislocation. No joint effusion. There is mild
narrowing of all joint spaces with spurring in all compartments. No
erosive change peer
IMPRESSION: Mild osteoarthritic change involving all compartments. No fracture
or joint effusion.

## 2018-08-11 NOTE — H&P (Signed)
[name] is a 37 y.o. female here for Discuss sono . Pt here for confirmation of pregnancy , unknown location of pregnancy   G0 LMP end of September . Today Bhcg [phone] U/S : Uterus anteverted  Thickened complex endometrium=40.539mm,Grape like pattern   BT : O+  No IUP seen  No free fluid seen  B/L ovaries appear wnl    Past Medical History:  has a past medical history of Anxiety attack, Arthritis, Bipolar 1 disorder (CMS-HCC), Cholestatic jaundice, Depression, Gallstone, Hypothyroidism (acquired), unspecified, Psychoactive substance use disorder, PTSD (post-traumatic stress disorder), unspecified, Social anxiety disorder, Suicide attempt (CMS-HCC), and Transaminitis.  Past Surgical History:  has a past surgical history that includes breast reduction surgery (2001); Cholecystectomy (05/31/2013); ercp (06/01/2013); HIP SURGERY SLIPPED EPIPHYSIS (1999); and KNEE SURGERY MULTIPLE (Left). Family History: family history includes ADD / ADHD in her sister; Alcohol abuse in her mother; Anxiety in her mother and sister; Bipolar disorder in her mother and sister; Cancer in her maternal grandfather; Drug abuse in her sister; Mental illness in her maternal grandfather; OCD in her maternal aunt and mother; Paranoid behavior in her sister; Physical abuse in her mother; Sexual abuse in her mother. Social History:  reports that she has been smoking. She has never used smokeless tobacco. She reports that she does not drink alcohol. OB/GYN History:          OB History    Gravida  1   Para  0   Term  0   Preterm  0   AB  0   Living  0     SAB  0   TAB  0   Ectopic  0   Molar  0   Multiple  0   Live Births  0          Allergies: has No Known Allergies. Medications:  Current Outpatient Medications:  .  levothyroxine (SYNTHROID, LEVOTHROID) 25 MCG tablet, Take 25 mcg by mouth once daily. Take on an empty stomach with a glass of water at least 30-60 minutes before  breakfast., Disp: , Rfl:  .  ergocalciferol, vitamin D2, 50,000 unit capsule, Take 50,000 Units by mouth once a week, Disp: , Rfl:   Review of Systems: General:                      No fatigue or weight loss Eyes:                           No vision changes Ears:                            No hearing difficulty Respiratory:                No cough or shortness of breath Pulmonary:                  No asthma or shortness of breath Cardiovascular:           No chest pain, palpitations, dyspnea on exertion Gastrointestinal:          No abdominal bloating, chronic diarrhea, constipations, masses, pain or hematochezia Genitourinary:             No hematuria, dysuria, abnormal vaginal discharge, pelvic pain, Menometrorrhagia Lymphatic:                   No swollen lymph  nodes Musculoskeletal:         No muscle weakness Neurologic:                  No extremity weakness, syncope, seizure disorder Psychiatric:                  No history of depression, delusions or suicidal/homicidal ideation    Exam:      Vitals:   08/10/18 1505  BP: (!) 146/107  Pulse: 95    Body mass index is 25.7 kg/m.  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    Plan:   I explained the findings on U/S and explained that this is most c/w a molar gestation .  I recommend a Suction D+C and analysis microarray of the POC .  Blood type pending , she may need Rhogam .  Benefits and risks to surgery: The proposed benefit of the surgery has been discussed  with the patient. The possible risks include, but are not limited to: organ injury to the bowel , bladder, ureters, and major blood vessels and nerves. There is a possibility of additional surgeries resulting from these injuries. There is also the risk of blood transfusion and the need to receive blood products during or after the procedure which may rarely lead to HIV or Hepatitis C infection. There is a risk of developing a deep venous thrombosis or a pulmonary embolism . There is the possibility of wound infection and also anesthetic complications, even the rare possibility of death. The patient understands these risks and wishes to proceed.      Return in about 1 day (around 08/11/2018).  [name] Prader, MD

## 2018-08-12 ENCOUNTER — Encounter
Admission: RE | Disposition: A | Discharge status: Home / Self Care | Payer: Self-pay | Source: Home / Self Care | Attending: Obstetrics and Gynecology

## 2018-08-12 ENCOUNTER — Ambulatory Visit: Payer: BLUE CROSS/BLUE SHIELD | Admitting: Registered Nurse

## 2018-08-12 ENCOUNTER — Other Ambulatory Visit: Payer: Self-pay

## 2018-08-12 ENCOUNTER — Ambulatory Visit
Admission: RE | Admit: 2018-08-12 | Discharge: 2018-08-12 | Disposition: A | Discharge status: Home / Self Care | Payer: BLUE CROSS/BLUE SHIELD | Attending: Obstetrics and Gynecology | Admitting: Obstetrics and Gynecology

## 2018-08-12 DIAGNOSIS — O99331 Smoking (tobacco) complicating pregnancy, first trimester: Secondary | ICD-10-CM | POA: Diagnosis not present

## 2018-08-12 DIAGNOSIS — E039 Hypothyroidism, unspecified: Secondary | ICD-10-CM | POA: Diagnosis not present

## 2018-08-12 DIAGNOSIS — O010 Classical hydatidiform mole: Secondary | ICD-10-CM | POA: Diagnosis not present

## 2018-08-12 DIAGNOSIS — O99281 Endocrine, nutritional and metabolic diseases complicating pregnancy, first trimester: Secondary | ICD-10-CM | POA: Diagnosis not present

## 2018-08-12 DIAGNOSIS — Z79890 Hormone replacement therapy (postmenopausal): Secondary | ICD-10-CM | POA: Insufficient documentation

## 2018-08-12 DIAGNOSIS — F17200 Nicotine dependence, unspecified, uncomplicated: Secondary | ICD-10-CM | POA: Diagnosis not present

## 2018-08-12 DIAGNOSIS — O020 Blighted ovum and nonhydatidiform mole: Secondary | ICD-10-CM | POA: Diagnosis present

## 2018-08-12 HISTORY — PX: DILATION AND EVACUATION: SHX1459

## 2018-08-12 LAB — CBC
HCT: 42 % (ref 36.0–46.0)
Hemoglobin: 14.5 g/dL (ref 12.0–15.0)
MCH: 33.1 pg (ref 26.0–34.0)
MCHC: 34.5 g/dL (ref 30.0–36.0)
MCV: 95.9 fL (ref 80.0–100.0)
NRBC: 0 % (ref 0.0–0.2)
Platelets: 275 10*3/uL (ref 150–400)
RBC: 4.38 MIL/uL (ref 3.87–5.11)
RDW: 12.7 % (ref 11.5–15.5)
WBC: 13.1 10*3/uL — AB (ref 4.0–10.5)

## 2018-08-12 LAB — BASIC METABOLIC PANEL
ANION GAP: 6 (ref 5–15)
BUN: 11 mg/dL (ref 6–20)
CO2: 23 mmol/L (ref 22–32)
Calcium: 9.1 mg/dL (ref 8.9–10.3)
Chloride: 106 mmol/L (ref 98–111)
Creatinine, Ser: 0.56 mg/dL (ref 0.44–1.00)
Glucose, Bld: 95 mg/dL (ref 70–99)
POTASSIUM: 3.6 mmol/L (ref 3.5–5.1)
SODIUM: 135 mmol/L (ref 135–145)

## 2018-08-12 LAB — TYPE AND SCREEN
ABO/RH(D): O POS
ANTIBODY SCREEN: NEGATIVE

## 2018-08-12 LAB — POCT PREGNANCY, URINE: Preg Test, Ur: POSITIVE — AB

## 2018-08-12 SURGERY — DILATION AND EVACUATION, UTERUS
Anesthesia: General | Site: Cervix

## 2018-08-12 MED ORDER — DEXAMETHASONE SODIUM PHOSPHATE 10 MG/ML IJ SOLN
INTRAMUSCULAR | Status: DC | PRN
  Administered 2018-08-12: 5 mg via INTRAVENOUS

## 2018-08-12 MED ORDER — DEXAMETHASONE SODIUM PHOSPHATE 10 MG/ML IJ SOLN
INTRAMUSCULAR | Status: AC
  Filled 2018-08-12: qty 1

## 2018-08-12 MED ORDER — ONDANSETRON HCL 4 MG/2ML IJ SOLN: 4.0000 mg | Freq: Once | INTRAMUSCULAR | Status: DC | PRN

## 2018-08-12 MED ORDER — METHYLERGONOVINE MALEATE 0.2 MG/ML IJ SOLN
INTRAMUSCULAR | Status: DC | PRN
  Administered 2018-08-12: 0.2 mg via INTRAMUSCULAR

## 2018-08-12 MED ORDER — LACTATED RINGERS IV SOLN
INTRAVENOUS | Status: DC
  Administered 2018-08-12: 09:00:00 via INTRAVENOUS

## 2018-08-12 MED ORDER — FENTANYL CITRATE (PF) 100 MCG/2ML IJ SOLN
INTRAMUSCULAR | Status: AC
  Filled 2018-08-12: qty 2

## 2018-08-12 MED ORDER — MIDAZOLAM HCL 2 MG/2ML IJ SOLN
INTRAMUSCULAR | Status: DC | PRN
  Administered 2018-08-12: 2 mg via INTRAVENOUS

## 2018-08-12 MED ORDER — LIDOCAINE HCL (CARDIAC) PF 100 MG/5ML IV SOSY
PREFILLED_SYRINGE | INTRAVENOUS | Status: DC | PRN
  Administered 2018-08-12: 80 mg via INTRAVENOUS

## 2018-08-12 MED ORDER — FAMOTIDINE 20 MG PO TABS
20.0000 mg | ORAL_TABLET | Freq: Once | ORAL | Status: AC
  Administered 2018-08-12: 20 mg via ORAL

## 2018-08-12 MED ORDER — SILVER NITRATE-POT NITRATE 75-25 % EX MISC
CUTANEOUS | Status: AC
  Filled 2018-08-12: qty 1

## 2018-08-12 MED ORDER — ONDANSETRON HCL 4 MG/2ML IJ SOLN
INTRAMUSCULAR | Status: DC | PRN
  Administered 2018-08-12: 4 mg via INTRAVENOUS

## 2018-08-12 MED ORDER — METHYLERGONOVINE MALEATE 0.2 MG/ML IJ SOLN
INTRAMUSCULAR | Status: AC
  Filled 2018-08-12: qty 1

## 2018-08-12 MED ORDER — FAMOTIDINE 20 MG PO TABS
ORAL_TABLET | ORAL | Status: AC
  Filled 2018-08-12: qty 1

## 2018-08-12 MED ORDER — ONDANSETRON HCL 4 MG/2ML IJ SOLN
INTRAMUSCULAR | Status: AC
  Filled 2018-08-12: qty 2

## 2018-08-12 MED ORDER — FENTANYL CITRATE (PF) 100 MCG/2ML IJ SOLN: 25.0000 ug | INTRAMUSCULAR | Status: DC | PRN

## 2018-08-12 MED ORDER — PROPOFOL 10 MG/ML IV BOLUS
INTRAVENOUS | Status: DC | PRN
  Administered 2018-08-12: 30 mg via INTRAVENOUS
  Administered 2018-08-12: 150 mg via INTRAVENOUS

## 2018-08-12 MED ORDER — MIDAZOLAM HCL 2 MG/2ML IJ SOLN
INTRAMUSCULAR | Status: AC
  Filled 2018-08-12: qty 2

## 2018-08-12 MED ORDER — FENTANYL CITRATE (PF) 100 MCG/2ML IJ SOLN
INTRAMUSCULAR | Status: DC | PRN
  Administered 2018-08-12 (×2): 25 ug via INTRAVENOUS
  Administered 2018-08-12: 50 ug via INTRAVENOUS

## 2018-08-12 SURGICAL SUPPLY — 21 items
CATH ROBINSON RED A/P 16FR (CATHETERS) ×3 IMPLANT
COVER WAND RF STERILE (DRAPES) ×3 IMPLANT
FILTER UTR ASPR SPEC (MISCELLANEOUS) ×1 IMPLANT
FLTR UTR ASPR SPEC (MISCELLANEOUS) ×3
GLOVE BIO SURGEON STRL SZ8 (GLOVE) ×5 IMPLANT
GOWN STRL REUS W/ TWL LRG LVL3 (GOWN DISPOSABLE) ×1 IMPLANT
GOWN STRL REUS W/ TWL XL LVL3 (GOWN DISPOSABLE) ×1 IMPLANT
GOWN STRL REUS W/TWL LRG LVL3 (GOWN DISPOSABLE) ×3
GOWN STRL REUS W/TWL XL LVL3 (GOWN DISPOSABLE) ×3
KIT TURNOVER CYSTO (KITS) ×3 IMPLANT
PACK DNC HYST (MISCELLANEOUS) ×3 IMPLANT
PAD OB MATERNITY 4.3X12.25 (PERSONAL CARE ITEMS) ×3 IMPLANT
PAD PREP 24X41 OB/GYN DISP (PERSONAL CARE ITEMS) ×3 IMPLANT
SET BERKELEY SUCTION TUBING (SUCTIONS) ×3 IMPLANT
TOWEL OR 17X26 4PK STRL BLUE (TOWEL DISPOSABLE) ×3 IMPLANT
VACURETTE 10 RIGID CVD (CANNULA) ×1 IMPLANT
VACURETTE 6 ASPIR F TIP BERK (CANNULA) ×3 IMPLANT
VACURETTE 7MM F TIP (CANNULA)
VACURETTE 7MM F TIP STRL (CANNULA) ×1 IMPLANT
VACURETTE 8 RIGID CVD (CANNULA) ×1 IMPLANT
VACURETTE 8MM F TIP (MISCELLANEOUS) ×3 IMPLANT

## 2018-08-12 NOTE — Anesthesia Post-op Follow-up Note (Signed)
Anesthesia QCDR form completed.        

## 2018-08-12 NOTE — Anesthesia Preprocedure Evaluation (Addendum)
Anesthesia Evaluation  Patient identified by MRN, date of birth, ID band Patient awake    Reviewed: Allergy & Precautions, NPO status , Patient's Chart, lab work & pertinent test results  History of Anesthesia Complications Negative for: history of anesthetic complications  Airway Mallampati: II       Dental   Pulmonary neg sleep apnea, neg COPD, Current Smoker, former smoker,           Cardiovascular (-) hypertension(-) Past MI and (-) CHF (-) dysrhythmias (-) Valvular Problems/Murmurs     Neuro/Psych neg Seizures Anxiety Depression Bipolar Disorder    GI/Hepatic Neg liver ROS, GERD  Medicated,  Endo/Other  neg diabetesHypothyroidism   Renal/GU negative Renal ROS     Musculoskeletal   Abdominal   Peds  Hematology   Anesthesia Other Findings   Reproductive/Obstetrics                           Anesthesia Physical Anesthesia Plan  ASA: II  Anesthesia Plan: General   Post-op Pain Management:    Induction: Intravenous  PONV Risk Score and Plan: 2 and Dexamethasone and Ondansetron  Airway Management Planned: LMA  Additional Equipment:   Intra-op Plan:   Post-operative Plan:   Informed Consent: I have reviewed the patients History and Physical, chart, labs and discussed the procedure including the risks, benefits and alternatives for the proposed anesthesia with the patient or authorized representative who has indicated his/her understanding and acceptance.     Plan Discussed with:   Anesthesia Plan Comments:         Anesthesia Quick Evaluation

## 2018-08-12 NOTE — Brief Op Note (Signed)
08/12/2018  10:40 AM  PATIENT:  [name] NearingElizabeth Nicki [name]  37 y.o. female  PRE-OPERATIVE DIAGNOSIS:  molar gestation, 6011 week EGA   POST-OPERATIVE DIAGNOSIS:  molar gestation  PROCEDURE:  Procedure(s): DILATATION AND EVACUATION (N/A)  SURGEON:  Surgeon(s) and Role:    * Schermerhorn, Ihor Austinhomas J, MD - Primary  PHYSICIAN ASSISTANT: none  ASSISTANTS: none   ANESTHESIA:   general  EBL:  25 mL   BLOOD ADMINISTERED:none  DRAINS: none and Urinary Catheter (Foley)   LOCAL MEDICATIONS USED:  NONE  SPECIMEN:  Source of Specimen:  products of conception   DISPOSITION OF SPECIMEN:  PATHOLOGY, genetic studies requested   COUNTS:  YES  TOURNIQUET:  * No tourniquets in log *  DICTATION: .Other Dictation: Dictation Number verbal  PLAN OF CARE: Discharge to home after PACU  PATIENT DISPOSITION:  PACU - hemodynamically stable.   Delay start of Pharmacological VTE agent (>24hrs) due to surgical blood loss or risk of bleeding: not applicable

## 2018-08-12 NOTE — Discharge Instructions (Addendum)
General Anesthesia, Adult, Care After These instructions provide you with information about caring for yourself after your procedure. Your health care provider may also give you more specific instructions. Your treatment has been planned according to current medical practices, but problems sometimes occur. Call your health care provider if you have any problems or questions after your procedure. What can I expect after the procedure? After the procedure, it is common to have:  Vomiting.  A sore throat.  Mental slowness.  It is common to feel:  Nauseous.  Cold or shivery.  Sleepy.  Tired.  Sore or achy, even in parts of your body where you did not have surgery.  Follow these instructions at home: For at least 24 hours after the procedure:  Do not: ? Participate in activities where you could fall or become injured. ? Drive. ? Use heavy machinery. ? Drink alcohol. ? Take sleeping pills or medicines that cause drowsiness. ? Make important decisions or sign legal documents. ? Take care of children on your own.  Rest. Eating and drinking  If you vomit, drink water, juice, or soup when you can drink without vomiting.  Drink enough fluid to keep your urine clear or pale yellow.  Make sure you have little or no nausea before eating solid foods.  Follow the diet recommended by your health care provider. General instructions  Have a responsible adult stay with you until you are awake and alert.  Return to your normal activities as told by your health care provider. Ask your health care provider what activities are safe for you.  Take over-the-counter and prescription medicines only as told by your health care provider.  If you smoke, do not smoke without supervision.  Keep all follow-up visits as told by your health care provider. This is important. Contact a health care provider if:  You continue to have nausea or vomiting at home, and medicines are not helpful.  You  cannot drink fluids or start eating again.  You cannot urinate after 8-12 hours.  You develop a skin rash.  You have fever.  You have increasing redness at the site of your procedure. Get help right away if:  You have difficulty breathing.  You have chest pain.  You have unexpected bleeding.  You feel that you are having a life-threatening or urgent problem. This information is not intended to replace advice given to you by your health care provider. Make sure you discuss any questions you have with your health care provider. Document Released: 11/23/2000 Document Revised: 01/20/2016 Document Reviewed: 08/01/2015 Elsevier Interactive Patient Education  2018 [employer]    Dilation and Curettage or Vacuum Curettage, Care After These instructions give you information about caring for yourself after your procedure. Your doctor may also give you more specific instructions. Call your doctor if you have any problems or questions after your procedure. Follow these instructions at home: Activity  Do not drive or use heavy machinery while taking prescription pain medicine.  For 24 hours after your procedure, avoid driving.  Take short walks often, followed by rest periods. Ask your doctor what activities are safe for you. After one or two days, you may be able to return to your normal activities.  Do not lift anything that is heavier than 10 lb (4.5 kg) until your doctor approves.  For at least 2 weeks, or as long as told by your doctor: ? Do not douche. ? Do not use tampons. ? Do not have sex. General instructions  Take over-the-counter and prescription medicines only as told by your doctor. This is very important if you take blood thinning medicine.  Do not take baths, swim, or use a hot tub until your doctor approves. Take showers instead of baths.  Wear compression stockings as told by your doctor.  It is up to you to get the results of your procedure. Ask your doctor  when your results will be ready.  Keep all follow-up visits as told by your doctor. This is important. Contact a doctor if:  You have very bad cramps that get worse or do not get better with medicine.  You have very bad pain in your belly (abdomen).  You cannot drink fluids without throwing up (vomiting).  You get pain in a different part of the area between your belly and thighs (pelvis).  You have bad-smelling discharge from your vagina.  You have a rash. Get help right away if:  You are bleeding a lot from your vagina. A lot of bleeding means soaking more than one sanitary pad in an hour, for 2 hours in a row.  You have clumps of blood (blood clots) coming from your vagina.  You have a fever or chills.  Your belly feels very tender or hard.  You have chest pain.  You have trouble breathing.  You cough up blood.  You feel dizzy.  You feel light-headed.  You pass out (faint).  You have pain in your neck or shoulder area. Summary  Take short walks often, followed by rest periods. Ask your doctor what activities are safe for you. After one or two days, you may be able to return to your normal activities.  Do not lift anything that is heavier than 10 lb (4.5 kg) until your doctor approves.  Do not take baths, swim, or use a hot tub until your doctor approves. Take showers instead of baths.  Contact your doctor if you have any symptoms of infection, like bad-smelling discharge from your vagina. This information is not intended to replace advice given to you by your health care provider. Make sure you discuss any questions you have with your health care provider. Document Released: 05/26/2008 Document Revised: 05/04/2016 Document Reviewed: 05/04/2016 Elsevier Interactive Patient Education  2017 [employer].

## 2018-08-12 NOTE — Anesthesia Procedure Notes (Signed)
Procedure Name: LMA Insertion Date/Time: 08/12/2018 10:13 AM Performed by: Karoline CaldwellStarr, Janashia Parco, CRNA Pre-anesthesia Checklist: Patient identified, Patient being monitored, Timeout performed, Emergency Drugs available and Suction available Patient Re-evaluated:Patient Re-evaluated prior to induction Oxygen Delivery Method: Circle system utilized Preoxygenation: Pre-oxygenation with 100% oxygen Induction Type: IV induction Ventilation: Mask ventilation without difficulty LMA: LMA inserted LMA Size: 3.5 Tube type: Oral Number of attempts: 1 Placement Confirmation: positive ETCO2 and breath sounds checked- equal and bilateral Tube secured with: Tape Dental Injury: Teeth and Oropharynx as per pre-operative assessment

## 2018-08-12 NOTE — Anesthesia Postprocedure Evaluation (Signed)
Anesthesia Post Note  Patient: [name] NearingElizabeth [name]  Procedure(s) Performed: DILATATION AND EVACUATION (N/A Cervix)  Patient location during evaluation: PACU Anesthesia Type: General Level of consciousness: awake and alert Pain management: pain level controlled Vital Signs Assessment: post-procedure vital signs reviewed and stable Respiratory status: spontaneous breathing and respiratory function stable Cardiovascular status: stable Anesthetic complications: no     Last Vitals:  Vitals:   08/12/18 1110 08/12/18 1125  BP: 113/86 117/74  Pulse: 81 (!) 54  Resp: 14 14  Temp:  36.7 C  SpO2: 100% 99%    Last Pain:  Vitals:   08/12/18 1125  TempSrc:   PainSc: 0-No pain                 Kristalyn Bergstresser K

## 2018-08-12 NOTE — Transfer of Care (Signed)
Immediate Anesthesia Transfer of Care Note  Patient: [name] [name]  Procedure(s) Performed: DILATATION AND EVACUATION (N/A Cervix)  Patient Location: PACU  Anesthesia Type:General  Level of Consciousness: sedated  Airway & Oxygen Therapy: Patient Spontanous Breathing and Patient connected to face mask oxygen  Post-op Assessment: Report given to RN and Post -op Vital signs reviewed and stable  Post vital signs: Reviewed and stable  Last Vitals:  Vitals Value Taken Time  BP 111/87 08/12/2018 10:55 AM  Temp 36.7 C 08/12/2018 10:53 AM  Pulse 80 08/12/2018 10:56 AM  Resp 22 08/12/2018 10:56 AM  SpO2 100 % 08/12/2018 10:56 AM  Vitals shown include unvalidated device data.  Last Pain:  Vitals:   08/12/18 0913  TempSrc: Temporal  PainSc: 3          Complications: No apparent anesthesia complications

## 2018-08-12 NOTE — Op Note (Signed)
NAMSallyanne Havers: Robbins, Aaliyah [facility] LLCNICKI MEDICAL RECORD [id number] ACCOUNT [id number] DATE OF BIRTH:04/02/1981 FACILITY: [facility] LOCATION: [facility] PHYSICIAN:Travanti Mcmanus Cloyde ReamsJ. Godric Lavell, MD  OPERATIVE REPORT  DATE OF PROCEDURE:  08/12/2018  PREOPERATIVE DIAGNOSES: 1.  Eleven-week gestation. 2.  Molar gestation.  POSTOPERATIVE DIAGNOSES: 1.  Eleven-week gestation. 2.  Molar gestation.  PROCEDURE:  Suction dilation and curettage.  ANESTHESIA:  General endotracheal anesthesia.  SURGEON:  Jennell Cornerhomas Intisar Claudio, MD  INDICATIONS:  A 37 year old gravida 1, para 0 patient at 2711 weeks estimated gestational age based on LMP.  The patient underwent an ultrasound in the office 2 days prior to the procedure that showed increased trophoblastic tissue in the endometrial  cavity with a beta hCG of 78,000.  Blood type 0 positive.  The appearance of the intrauterine pregnancy appeared grape-like consistent with a molar gestation.  DESCRIPTION OF PROCEDURE:  After adequate general endotracheal anesthesia, the patient was placed in dorsal supine position, legs in the candy cane stirrups.  Lower abdomen, perineum, and vagina were prepped and draped in normal sterile fashion.  Timeout  was performed.  Straight catheterization of the bladder yielded 50 mL clear urine.  A weighted speculum was placed in the posterior vaginal vault, and the anterior cervix was grasped with a single-tooth tenaculum.  The cervix was dilated to a #20 Hanks  dilator without difficulty.  A flexible 8 suction curette was then placed into the endometrial cavity and suction curettage performed.  Tissue was removed consistent with products of conception.  A sharp curettage was then performed with scant additional  tissue.  Good uterine cry was noted.  Repeat suction curettage with no additional products of conception.  Good hemostasis was noted.  The patient did receive 0.2 mg intramuscular Methergine at the end of the case.  The patient tolerated  the procedure  well.  ESTIMATED BLOOD LOSS:  25 mL.  INTRAOPERATIVE FLUIDS:  400 mL.  URINE OUTPUT:  50 mL.  The patient was taken to recovery room in good condition.  LN/NUANCE  D:08/12/2018 T:08/12/2018 JOB:[id number]

## 2018-08-12 NOTE — Progress Notes (Signed)
Ready for surgery . Marland Kitchen. All questions answered . proceed

## 2018-08-16 LAB — SURGICAL PATHOLOGY

## 2023-04-20 ENCOUNTER — Emergency Department
Admission: EM | Admit: 2023-04-20 | Discharge: 2023-04-20 | Disposition: A | Discharge status: Home / Self Care | Payer: Self-pay | Attending: Emergency Medicine | Admitting: Emergency Medicine

## 2023-04-20 ENCOUNTER — Emergency Department: Payer: Self-pay

## 2023-04-20 ENCOUNTER — Other Ambulatory Visit: Payer: Self-pay

## 2023-04-20 DIAGNOSIS — R079 Chest pain, unspecified: Secondary | ICD-10-CM | POA: Insufficient documentation

## 2023-04-20 DIAGNOSIS — R1084 Generalized abdominal pain: Secondary | ICD-10-CM | POA: Insufficient documentation

## 2023-04-20 DIAGNOSIS — E039 Hypothyroidism, unspecified: Secondary | ICD-10-CM | POA: Insufficient documentation

## 2023-04-20 DIAGNOSIS — Z20822 Contact with and (suspected) exposure to covid-19: Secondary | ICD-10-CM | POA: Insufficient documentation

## 2023-04-20 DIAGNOSIS — R1110 Vomiting, unspecified: Secondary | ICD-10-CM | POA: Insufficient documentation

## 2023-04-20 DIAGNOSIS — R197 Diarrhea, unspecified: Secondary | ICD-10-CM | POA: Insufficient documentation

## 2023-04-20 LAB — URINALYSIS, ROUTINE W REFLEX MICROSCOPIC
Bacteria, UA: NONE SEEN
Bilirubin Urine: NEGATIVE
Glucose, UA: NEGATIVE mg/dL
Hgb urine dipstick: NEGATIVE
Ketones, ur: 20 mg/dL — AB
Leukocytes,Ua: NEGATIVE
Nitrite: NEGATIVE
Protein, ur: 100 mg/dL — AB
Specific Gravity, Urine: 1.031 — ABNORMAL HIGH (ref 1.005–1.030)
pH: 5 (ref 5.0–8.0)

## 2023-04-20 LAB — TROPONIN I (HIGH SENSITIVITY)
Troponin I (High Sensitivity): <2 ng/L (ref <18)
Troponin I (High Sensitivity): <2 ng/L (ref <18)

## 2023-04-20 LAB — CBC
HCT: 42.7 % (ref 36.0–46.0)
Hemoglobin: 14.1 g/dL (ref 12.0–15.0)
MCH: 32.2 pg (ref 26.0–34.0)
MCHC: 33 g/dL (ref 30.0–36.0)
MCV: 97.5 fL (ref 80.0–100.0)
Platelets: 255 10*3/uL (ref 150–400)
RBC: 4.38 MIL/uL (ref 3.87–5.11)
RDW: 12.4 % (ref 11.5–15.5)
WBC: 13.6 10*3/uL — ABNORMAL HIGH (ref 4.0–10.5)
nRBC: 0 % (ref 0.0–0.2)

## 2023-04-20 LAB — RESP PANEL BY RT-PCR (RSV, FLU A&B, COVID)  RVPGX2
Influenza A by PCR: NEGATIVE
Influenza B by PCR: NEGATIVE
Resp Syncytial Virus by PCR: NEGATIVE
SARS Coronavirus 2 by RT PCR: NEGATIVE

## 2023-04-20 LAB — POC URINE PREG, ED: Preg Test, Ur: NEGATIVE

## 2023-04-20 LAB — BASIC METABOLIC PANEL
Anion gap: 9 (ref 5–15)
BUN: 10 mg/dL (ref 6–20)
CO2: 18 mmol/L — ABNORMAL LOW (ref 22–32)
Calcium: 9.4 mg/dL (ref 8.9–10.3)
Chloride: 108 mmol/L (ref 98–111)
Creatinine, Ser: 0.53 mg/dL (ref 0.44–1.00)
GFR, Estimated: >60 mL/min (ref >60)
Glucose, Bld: 162 mg/dL — ABNORMAL HIGH (ref 70–99)
Potassium: 3.7 mmol/L (ref 3.5–5.1)
Sodium: 135 mmol/L (ref 135–145)

## 2023-04-20 LAB — LIPASE, BLOOD: Lipase: 26 U/L (ref 11–51)

## 2023-04-20 MED ORDER — DICYCLOMINE HCL 10 MG PO CAPS
10.0000 mg | ORAL_CAPSULE | Freq: Once | ORAL | Status: AC
  Administered 2023-04-20: 10 mg via ORAL
  Filled 2023-04-20: qty 1

## 2023-04-20 MED ORDER — ONDANSETRON HCL 4 MG/2ML IJ SOLN
4.0000 mg | Freq: Once | INTRAMUSCULAR | Status: AC
  Administered 2023-04-20: 4 mg via INTRAVENOUS
  Filled 2023-04-20: qty 2

## 2023-04-20 MED ORDER — MORPHINE SULFATE (PF) 4 MG/ML IV SOLN
4.0000 mg | Freq: Once | INTRAVENOUS | Status: AC
  Administered 2023-04-20: 4 mg via INTRAVENOUS
  Filled 2023-04-20: qty 1

## 2023-04-20 MED ORDER — IOHEXOL 300 MG/ML  SOLN
100.0000 mL | Freq: Once | INTRAMUSCULAR | Status: AC | PRN
  Administered 2023-04-20: 100 mL via INTRAVENOUS

## 2023-04-20 MED ORDER — ONDANSETRON 4 MG PO TBDP: 4.0000 mg | ORAL_TABLET | Freq: Three times a day (TID) | ORAL | 0 refills | Status: AC | PRN

## 2023-04-20 MED ORDER — DICYCLOMINE HCL 10 MG PO CAPS: 10.0000 mg | ORAL_CAPSULE | Freq: Three times a day (TID) | ORAL | 0 refills | Status: AC

## 2023-04-20 NOTE — ED Provider Notes (Signed)
----------------------------------------- 4:26 PM on 04/20/2023 -----------------------------------------  Blood pressure 113/77, pulse 67, temperature 97.6 F (36.4 C), resp. rate 16, height 5\' 11"  (1.803 m), weight 72.6 kg, last menstrual period 04/06/2023, SpO2 100%, unknown if currently breastfeeding.  Assuming care from Dr. Bridget Hartshorn, PA-C/NP-C.  In short, [name] [name] is a 42 y.o. female with a chief complaint of Chest Pain .  Refer to the original H&P for additional details.  The current plan of care is to await pending CT and disposition patient accordingly.  ____________________________________________    ED Results / Procedures / Treatments   Labs (all labs ordered are listed, but only abnormal results are displayed) Labs Reviewed  BASIC METABOLIC PANEL - Abnormal; Notable for the following components:      Result Value   CO2 18 (*)    Glucose, Bld 162 (*)    All other components within normal limits  CBC - Abnormal; Notable for the following components:   WBC 13.6 (*)    All other components within normal limits  URINALYSIS, ROUTINE W REFLEX MICROSCOPIC - Abnormal; Notable for the following components:   Color, Urine YELLOW (*)    APPearance HAZY (*)    Specific Gravity, Urine 1.031 (*)    Ketones, ur 20 (*)    Protein, ur 100 (*)    Non Squamous Epithelial PRESENT (*)    All other components within normal limits  RESP PANEL BY RT-PCR (RSV, FLU A&B, COVID)  RVPGX2  LIPASE, BLOOD  POC URINE PREG, ED  TROPONIN I (HIGH SENSITIVITY)  TROPONIN I (HIGH SENSITIVITY)     EKG    RADIOLOGY  I personally viewed and evaluated these images as part of my medical decision making, as well as reviewing the written report by the radiologist.  ED Provider Interpretation: No acute findings to explain the patient's symptoms.  Incidental, liver lesion noted.  Nonemergent outpatient MRI suggested for further evaluation.  CT ABDOMEN PELVIS W CONTRAST  Result  Date: 04/20/2023 CLINICAL DATA:  Nausea, vomiting, and diarrhea since this morning. EXAM: CT ABDOMEN AND PELVIS WITH CONTRAST TECHNIQUE: Multidetector CT imaging of the abdomen and pelvis was performed using the standard protocol following bolus administration of intravenous contrast. RADIATION DOSE REDUCTION: This exam was performed according to the departmental dose-optimization program which includes automated exposure control, adjustment of the mA and/or kV according to patient size and/or use of iterative reconstruction technique. CONTRAST:  OMNIPAQUE IOHEXOL 300 MG/ML  SOLN COMPARISON:  CT abdomen pelvis dated May 29, 2013. FINDINGS: Lower chest: No acute abnormality. Hepatobiliary: New 2.3 cm hypodense lesion in the inferior right liver adjacent to the gallbladder fossa (series 2, image 21). Prior cholecystectomy. No biliary dilatation. Pancreas: Unremarkable. No pancreatic ductal dilatation or surrounding inflammatory changes. Spleen: Normal in size without focal abnormality. Adrenals/Urinary Tract: Adrenal glands are unremarkable. Kidneys are normal, without renal calculi, focal lesion, or hydronephrosis. Bladder is unremarkable. Stomach/Bowel: Stomach is within normal limits. Appendix appears normal. No evidence of bowel wall thickening, distention, or inflammatory changes. Vascular/Lymphatic: Aortic atherosclerosis. No enlarged abdominal or pelvic lymph nodes. Reproductive: Uterus and bilateral adnexa are unremarkable. Other: Trace free fluid in the pelvis, likely physiologic. No pneumoperitoneum. Musculoskeletal: No acute or significant osseous findings. 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 IMPRESSION: 1. No acute intra-abdominal process. 2. New 2.3 cm hypodense lesion in the inferior right liver adjacent to the gallbladder fossa. Recommend further evaluation with non-emergent outpatient liver protocol MRI abdomen with  and without  contrast. 3.  Aortic Atherosclerosis (ICD10-I70.0). Electronically Signed   By: Obie Dredge M.D.   On: 04/20/2023 16:21   DG Chest 2 View  Result Date: 04/20/2023 CLINICAL DATA:  Chest pain. EXAM: CHEST - 2 VIEW COMPARISON:  03/06/2016 FINDINGS: The cardiomediastinal contours are normal. The lungs are clear. Pulmonary vasculature is normal. No consolidation, pleural effusion, or pneumothorax. No acute osseous abnormalities are seen. IMPRESSION: Negative radiographs of the chest. Electronically Signed   By: Narda Rutherford M.D.   On: 04/20/2023 12:34     PROCEDURES:  Critical Care performed: No  Procedures   MEDICATIONS ORDERED IN ED: Medications  ondansetron (ZOFRAN) injection 4 mg (4 mg Intravenous Given 04/20/23 1236)  ondansetron (ZOFRAN) injection 4 mg (4 mg Intravenous Given 04/20/23 1436)  morphine (PF) 4 MG/ML injection 4 mg (4 mg Intravenous Given 04/20/23 1437)  iohexol (OMNIPAQUE) 300 MG/ML solution 100 mL (100 mLs Intravenous Contrast Given 04/20/23 1452)  dicyclomine (BENTYL) capsule 10 mg (10 mg Oral Given 04/20/23 1707)     IMPRESSION / MDM / ASSESSMENT AND PLAN / ED COURSE  I reviewed the triage vital signs and the nursing notes.                              Differential diagnosis includes, but is not limited to, ACS, aortic dissection, pulmonary embolism, cardiac tamponade, pneumothorax, pneumonia, pericarditis, myocarditis, GI-related causes including esophagitis/gastritis, and musculoskeletal chest wall pain.    Patient's presentation is most consistent with acute complicated illness / injury requiring diagnostic workup.  Patient's diagnosis is consistent with nonspecific chest pain and generalized abdominal discomfort.  This may represent a viral etiology.  Patient presents in no acute distress without any acute dehydration, respiratory distress, toxic appearance.  Patient with reassuring chest pain workup, without evidence of acute coronary syndrome, intrathoracic  process, or acute abdominal process.  Negative troponin x 2.  No critical anemia, leukocytosis, electrolyte abnormalities.  EKG without evidence of malignant arrhythmia.  Patient will be discharged home with prescriptions for dicyclomine and Zofran.  She is advised to follow with her primary provider, for outpatient nonemergent MRI for her incidental liver lesion as discussed.  Patient is to follow up with her primary provider as needed or otherwise directed. Patient is given ED precautions to return to the ED for any worsening or new symptoms.  FINAL CLINICAL IMPRESSION(S) / ED DIAGNOSES   Final diagnoses:  Generalized abdominal pain  Nonspecific chest pain     Rx / DC Orders   ED Discharge Orders          Ordered    dicyclomine (BENTYL) 10 MG capsule  3 times daily before meals        04/20/23 1638    ondansetron (ZOFRAN-ODT) 4 MG disintegrating tablet  Every 8 hours PRN        04/20/23 1638             Note:  This document was prepared using Dragon voice recognition software and may include unintentional dictation errors.    Lissa Hoard, PA-C 04/20/23 1740    Sharman Cheek, MD 04/20/23 [phone]

## 2023-04-20 NOTE — ED Triage Notes (Signed)
Pt to ED for chest pain x2 hours. N/v/d started this am.

## 2023-04-20 NOTE — ED Provider Notes (Signed)
[facility] Provider Note    Event Date/Time   First MD Initiated Contact with Patient 04/20/23 1148     (approximate)   History   Chest Pain   HPI  [name] [name] is a 42 y.o. female   presents to the ED with complaint of chest pain that started approximately 2 hours prior to arrival after patient has had multiple episodes of vomiting this morning.  Patient also reports approximately 4 episodes of diarrhea.  She states she is upset due to her brother-in-law passing away believes that she might have eaten "bad food".   She is unaware of any fever and denies chills.  Denies urinary symptoms, abdominal pain or difficulty breathing.  Patient has a history of bipolar affective disorder, psychoactive substance use, PTSD, social anxiety, hypothyroidism, cholecystectomy 2014.      Physical Exam   Triage Vital Signs: ED Triage Vitals  Encounter Vitals Group     BP 04/20/23 1108 113/77     Systolic BP Percentile --      Diastolic BP Percentile --      Pulse Rate 04/20/23 1108 67     Resp 04/20/23 1108 16     Temp 04/20/23 1110 97.6 F (36.4 C)     Temp src --      SpO2 04/20/23 1108 100 %     Weight 04/20/23 1108 160 lb (72.6 kg)     Height 04/20/23 1108 5\' 11"  (1.803 m)     Head Circumference --      Peak Flow --      Pain Score 04/20/23 1108 9     Pain Loc --      Pain Education --      Exclude from Growth Chart --     Most recent vital signs: Vitals:   04/20/23 1108 04/20/23 1110  BP: 113/77   Pulse: 67   Resp: 16   Temp:  97.6 F (36.4 C)  SpO2: 100%      General: Awake, no distress.  Tearful. CV:  Good peripheral perfusion.  Heart rate and rate and rhythm. Resp:  Normal effort.  Lungs are clear bilaterally. Abd:  No distention.  Soft, moderate diffuse tenderness on palpation.  No referred or rebound tenderness appreciated. Other:     ED Results / Procedures / Treatments   Labs (all labs ordered are listed, but only  abnormal results are displayed) Labs Reviewed  BASIC METABOLIC PANEL - Abnormal; Notable for the following components:      Result Value   CO2 18 (*)    Glucose, Bld 162 (*)    All other components within normal limits  CBC - Abnormal; Notable for the following components:   WBC 13.6 (*)    All other components within normal limits  URINALYSIS, ROUTINE W REFLEX MICROSCOPIC - Abnormal; Notable for the following components:   Color, Urine YELLOW (*)    APPearance HAZY (*)    Specific Gravity, Urine 1.031 (*)    Ketones, ur 20 (*)    Protein, ur 100 (*)    Non Squamous Epithelial PRESENT (*)    All other components within normal limits  RESP PANEL BY RT-PCR (RSV, FLU A&B, COVID)  RVPGX2  LIPASE, BLOOD  POC URINE PREG, ED  TROPONIN I (HIGH SENSITIVITY)  TROPONIN I (HIGH SENSITIVITY)     EKG  Vent. rate 56 BPM PR interval 158 ms QRS duration 82 ms QT/QTcB 460/443 ms P-R-T axes 84 94 74  Sinus bradycardia with sinus arrhythmia Rightward axis Borderline ECG When compared with ECG of 06-Mar-2016 00:22, No significant change was found    RADIOLOGY Chest x-ray images were reviewed by myself independent of the radiologist is negative for acute cardiopulmonary changes.  CT abdomen pelvis pending at this time.  PROCEDURES:  Critical Care performed:   Procedures   MEDICATIONS ORDERED IN ED: Medications  ondansetron (ZOFRAN) injection 4 mg (4 mg Intravenous Given 04/20/23 1236)  ondansetron (ZOFRAN) injection 4 mg (4 mg Intravenous Given 04/20/23 1436)  morphine (PF) 4 MG/ML injection 4 mg (4 mg Intravenous Given 04/20/23 1437)  iohexol (OMNIPAQUE) 300 MG/ML solution 100 mL (100 mLs Intravenous Contrast Given 04/20/23 1452)     IMPRESSION / MDM / ASSESSMENT AND PLAN / ED COURSE  I reviewed the triage vital signs and the nursing notes.   Differential diagnosis includes, but is not limited to, viral gastroenteritis, colitis, diverticulitis, exacerbation of situational  anxiety.  ----------------------------------------- 4:01 PM on 04/20/2023 ----------------------------------------- CT scan report pending at this time.  Patient stable.  The remaining care of this patient has been discussed with the Leitha Bleak, PA-C who will be overseeing this patient when CT scan report has been received.      Patient's presentation is most consistent with acute presentation with potential threat to life or bodily function.  FINAL CLINICAL IMPRESSION(S) / ED DIAGNOSES   Final diagnoses:  Generalized abdominal pain     Rx / DC Orders   ED Discharge Orders     None        Note:  This document was prepared using Dragon voice recognition software and may include unintentional dictation errors.   Tommi Rumps, PA-C 04/20/23 2725    Trinna Post, MD 04/22/23 [phone]

## 2023-04-20 NOTE — Discharge Instructions (Addendum)
Your exam, labs, EKG, chest x-ray, and CT scan all normal reassuring at this time.  No signs of any serious underlying cause for your abdominal and chest pain.  Your symptoms may represent a viral etiology versus a bad food exposure.  You should follow with primary provider return to the ED as needed.

## 2023-04-20 NOTE — ED Notes (Signed)
Called patient to inform of need to contact PCP to schedule Non Emergent MRI for Liver Lesion. Patient expressed understanding. Will setup My Chart for access to Emergency Department Labs and imaging
